# Patient Record
Sex: Male | Born: 1980 | Race: Black or African American | Hispanic: No | Marital: Single | Smoking: Current every day smoker
Health system: Southern US, Community
[De-identification: ages and names within clinical notes are randomized; demographics above are authoritative.]

## PROBLEM LIST (undated history)

## (undated) DIAGNOSIS — F209 Schizophrenia, unspecified: Secondary | ICD-10-CM

---

## 2015-06-19 ENCOUNTER — Emergency Department (HOSPITAL_COMMUNITY)
Admission: EM | Admit: 2015-06-19 | Discharge: 2015-06-21 | Disposition: A | Discharge status: Other Acute Inpatient Hospital | Payer: Medicaid Other | Attending: Emergency Medicine | Admitting: Emergency Medicine

## 2015-06-19 ENCOUNTER — Encounter (HOSPITAL_COMMUNITY): Payer: Self-pay | Admitting: Emergency Medicine

## 2015-06-19 DIAGNOSIS — F209 Schizophrenia, unspecified: Secondary | ICD-10-CM | POA: Diagnosis present

## 2015-06-19 DIAGNOSIS — F200 Paranoid schizophrenia: Secondary | ICD-10-CM | POA: Diagnosis present

## 2015-06-19 HISTORY — DX: Schizophrenia, unspecified: F20.9

## 2015-06-19 LAB — CBC
HCT: 49.4 % (ref 39.0–52.0)
HEMOGLOBIN: 17.9 g/dL — AB (ref 13.0–17.0)
MCH: 32.3 pg (ref 26.0–34.0)
MCHC: 36.2 g/dL — ABNORMAL HIGH (ref 30.0–36.0)
MCV: 89.2 fL (ref 78.0–100.0)
PLATELETS: 249 10*3/uL (ref 150–400)
RBC: 5.54 MIL/uL (ref 4.22–5.81)
RDW: 12.3 % (ref 11.5–15.5)
WBC: 10 10*3/uL (ref 4.0–10.5)

## 2015-06-19 LAB — RAPID URINE DRUG SCREEN, HOSP PERFORMED
AMPHETAMINES: NOT DETECTED
BENZODIAZEPINES: NOT DETECTED
Barbiturates: NOT DETECTED
COCAINE: NOT DETECTED
OPIATES: NOT DETECTED
TETRAHYDROCANNABINOL: POSITIVE — AB

## 2015-06-19 LAB — COMPREHENSIVE METABOLIC PANEL
ALK PHOS: 48 U/L (ref 38–126)
ALT: 38 U/L (ref 17–63)
ANION GAP: 9 (ref 5–15)
AST: 27 U/L (ref 15–41)
Albumin: 4.7 g/dL (ref 3.5–5.0)
BUN: 11 mg/dL (ref 6–20)
CALCIUM: 9.5 mg/dL (ref 8.9–10.3)
CO2: 20 mmol/L — AB (ref 22–32)
Chloride: 107 mmol/L (ref 101–111)
Creatinine, Ser: 1.01 mg/dL (ref 0.61–1.24)
GFR calc non Af Amer: >60 mL/min (ref >60)
Glucose, Bld: 105 mg/dL — ABNORMAL HIGH (ref 65–99)
POTASSIUM: 3.7 mmol/L (ref 3.5–5.1)
SODIUM: 136 mmol/L (ref 135–145)
TOTAL PROTEIN: 8.3 g/dL — AB (ref 6.5–8.1)
Total Bilirubin: 0.8 mg/dL (ref 0.3–1.2)

## 2015-06-19 LAB — ETHANOL: Alcohol, Ethyl (B): <5 mg/dL (ref <5)

## 2015-06-19 LAB — ACETAMINOPHEN LEVEL

## 2015-06-19 LAB — SALICYLATE LEVEL

## 2015-06-19 MED ORDER — NICOTINE 21 MG/24HR TD PT24
21.0000 mg | MEDICATED_PATCH | Freq: Every day | TRANSDERMAL | Status: DC
  Administered 2015-06-20 – 2015-06-21 (×2): 21 mg via TRANSDERMAL
  Filled 2015-06-19: qty 1

## 2015-06-19 MED ORDER — IBUPROFEN 200 MG PO TABS: 600.0000 mg | ORAL_TABLET | Freq: Three times a day (TID) | ORAL | Status: DC | PRN

## 2015-06-19 MED ORDER — ACETAMINOPHEN 325 MG PO TABS
650.0000 mg | ORAL_TABLET | ORAL | Status: DC | PRN
  Administered 2015-06-19: 650 mg via ORAL
  Filled 2015-06-19: qty 2

## 2015-06-19 MED ORDER — LORAZEPAM 1 MG PO TABS
0.0000 mg | ORAL_TABLET | Freq: Two times a day (BID) | ORAL | Status: DC
Start: 2015-06-22 — End: 2015-06-20

## 2015-06-19 MED ORDER — LORAZEPAM 1 MG PO TABS: 1.0000 mg | ORAL_TABLET | Freq: Three times a day (TID) | ORAL | Status: DC | PRN

## 2015-06-19 MED ORDER — ONDANSETRON HCL 4 MG PO TABS: 4.0000 mg | ORAL_TABLET | Freq: Three times a day (TID) | ORAL | Status: DC | PRN

## 2015-06-19 MED ORDER — LORAZEPAM 1 MG PO TABS
0.0000 mg | ORAL_TABLET | Freq: Four times a day (QID) | ORAL | Status: DC
  Administered 2015-06-19 – 2015-06-20 (×2): 2 mg via ORAL
  Filled 2015-06-19 (×2): qty 2

## 2015-06-19 NOTE — ED Notes (Signed)
[facility] Expected date: 06/19/15 Expected time: 11:28 PM Means of arrival:  Comments: [employer]
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
## 2015-06-19 NOTE — ED Notes (Signed)
Patient irritable, does not want to be in hospital. Patient restless, pacing, punched walls in his room. Hesitantly agreed to take Ativan and Tylenol.  Oriented to the unit. Encouragement offered. Meal provided.   Q 15 safety checks in place.
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
## 2015-06-19 NOTE — ED Notes (Signed)
Patient is hitting the walls and television, he also pacing back and fourth in and out of his room.

## 2015-06-20 DIAGNOSIS — F200 Paranoid schizophrenia: Secondary | ICD-10-CM | POA: Diagnosis present

## 2015-06-20 MED ORDER — BENZTROPINE MESYLATE 1 MG PO TABS
0.5000 mg | ORAL_TABLET | Freq: Two times a day (BID) | ORAL | Status: DC
  Administered 2015-06-20 – 2015-06-21 (×3): 0.5 mg via ORAL
  Filled 2015-06-20 (×3): qty 1

## 2015-06-20 MED ORDER — TRAZODONE HCL 100 MG PO TABS: 100.0000 mg | ORAL_TABLET | Freq: Every evening | ORAL | Status: DC | PRN

## 2015-06-20 MED ORDER — HALOPERIDOL 5 MG PO TABS
5.0000 mg | ORAL_TABLET | Freq: Two times a day (BID) | ORAL | Status: DC
  Administered 2015-06-20 – 2015-06-21 (×3): 5 mg via ORAL
  Filled 2015-06-20 (×3): qty 1

## 2015-06-20 MED ORDER — OXCARBAZEPINE 300 MG PO TABS
300.0000 mg | ORAL_TABLET | Freq: Two times a day (BID) | ORAL | Status: DC
Start: 2015-06-20 — End: 2015-06-21
  Administered 2015-06-20 – 2015-06-21 (×3): 300 mg via ORAL
  Filled 2015-06-20 (×3): qty 1

## 2015-06-20 NOTE — ED Notes (Signed)
Patient noted sleeping in room. No complaints, stable, in no acute distress. Q15 minute rounds and monitoring via [employer] to continue.  

## 2015-06-20 NOTE — BH Assessment (Signed)
[facility] Assessment Progress Note  Per Thedore MinsMojeed Akintayo, MD, this pt requires psychiatric hospitalization at this time.  He presents under faulty IVC initiated by his brother, but Dr Jannifer FranklinAkintayo has initiated a new IVC.  [place], and at [employer]17 Magistrate Antonelli confirms receipt.  Berneice Heinrichina Tate, RN, [facility] has assigned pt to Rm [id number].  [facility]  Pt's nurse has been notified, and agrees to call report to [phone].  Pt is to be transported via Patent examinerlaw enforcement.  Doylene Canninghomas Rilla Buckman, MA Triage Specialist [phone]

## 2015-06-20 NOTE — Consult Note (Signed)
[facility] Consult   Reason for Consult:  Aggressive behavior Referring Physician:  ED Provider Patient Identification: Mike [name] MRN:  [id number] Principal Diagnosis: Paranoid schizophrenia [facility]) Diagnosis:   Patient Active Problem List   Diagnosis Date Noted  . Paranoid schizophrenia ([place]) [F20.0] 06/20/2015    Total Time spent with patient: 30 minutes  Subjective:   Mike [name] is a 35 y.o. male patient admitted with aggressive behavior.  HPI:  Mike [name] was was seen today in the ED for aggressive behaviors.  It was reported that he lived with his brother and they got into an argument just before he was brought into the ED.  He has a history of schizoaffective disorder.  He was living in [place] up until recently and ran out of his medications.  He denied non compliance with his meds.  He reports that he hears voices intermittently and is a "little paranoid" at times.  Per patient he takes Depakote, Haldol, Cogentin and Benadryl.  He has been in [place] 2-3 months.    Per chart review, the patient has knocked down holes in the wall, extremely violent and known to self mutilate.    Past Psychiatric History: see HPI  Risk to Self: Suicidal Ideation: No Suicidal Intent: No Is patient at risk for suicide?: No Suicidal Plan?: No Access to Means: No What has been your use of drugs/alcohol within the last 12 months?: Pt denies but Pt's brother reports Pt uses cocaine, marijuana and alcohol How many times?: 0 Other Self Harm Risks: Pt has history of cutting himself Triggers for Past Attempts: None known Intentional Self Injurious Behavior: Cutting Comment - Self Injurious Behavior: Pt has history of cutting his arms Risk to Others: Homicidal Ideation: No Thoughts of Harm to Others: No Current Homicidal Intent: No Current Homicidal Plan: No Access to Homicidal Means: No Identified Victim: None History of harm to others?: Yes Assessment of Violence: On  admission Violent Behavior Description: See assessment note Does patient have access to weapons?: No Criminal Charges Pending?: No Does patient have a court date: No Prior Inpatient Therapy: Prior Inpatient Therapy: Yes Prior Therapy Dates: unknown Prior Therapy Facilty/Provider(s): unknown Reason for Treatment: unknown Prior Outpatient Therapy: Prior Outpatient Therapy: Yes Prior Therapy Dates: 2016 Prior Therapy Facilty/Provider(s): Provider in [place] Reason for Treatment: Schizophrenia Does patient have an ACCT team?: No Does patient have Intensive In-House Services?  : No Does patient have [facility] services? : No Does patient have P4CC services?: No  Past Medical History:  Past Medical History  Diagnosis Date  . Schizophrenia ([place])    History reviewed. No pertinent past surgical history. Family History: No family history on file. Family Psychiatric  History: see HPI Social History:  History  Alcohol Use  . Yes     History  Drug Use Not on file    Social History   Social History  . Marital Status: Single    Spouse Name: N/A  . Number of Children: N/A  . Years of Education: N/A   Social History Main Topics  . Smoking status: Never Smoker   . Smokeless tobacco: None  . Alcohol Use: Yes  . Drug Use: None  . Sexual Activity: Not Asked   Other Topics Concern  . None   Social History Narrative  . None   Additional Social History:    Allergies:  Not on File  Labs:  Results for orders placed or performed during the hospital encounter of 06/19/15 (from the past 48 hour(s))  Comprehensive metabolic panel     Status: Abnormal   Collection Time: 06/19/15  8:08 PM  Result Value Ref Range   Sodium 136 135 - 145 mmol/L   Potassium 3.7 3.5 - 5.1 mmol/L   Chloride 107 101 - 111 mmol/L   CO2 20 (L) 22 - 32 mmol/L   Glucose, Bld 105 (H) 65 - 99 mg/dL   BUN 11 6 - 20 mg/dL   Creatinine, Ser 1.01 0.61 - 1.24 mg/dL   Calcium 9.5 8.9 - 10.3 mg/dL   Total Protein  8.3 (H) 6.5 - 8.1 g/dL   Albumin 4.7 3.5 - 5.0 g/dL   AST 27 15 - 41 U/L   ALT 38 17 - 63 U/L   Alkaline Phosphatase 48 38 - 126 U/L   Total Bilirubin 0.8 0.3 - 1.2 mg/dL   GFR calc non Af Amer >60 >60 mL/min   GFR calc Af Amer >60 >60 mL/min    Comment: (NOTE) The eGFR has been calculated using the CKD EPI equation. This calculation has not been validated in all clinical situations. eGFR's persistently <60 mL/min signify possible Chronic Kidney Disease.    Anion gap 9 5 - 15  cbc     Status: Abnormal   Collection Time: 06/19/15  8:08 PM  Result Value Ref Range   WBC 10.0 4.0 - 10.5 K/uL   RBC 5.54 4.22 - 5.81 MIL/uL   Hemoglobin 17.9 (H) 13.0 - 17.0 g/dL   HCT 49.4 39.0 - 52.0 %   MCV 89.2 78.0 - 100.0 fL   MCH 32.3 26.0 - 34.0 pg   MCHC 36.2 (H) 30.0 - 36.0 g/dL   RDW 12.3 11.5 - 15.5 %   Platelets 249 150 - 400 K/uL  Ethanol     Status: None   Collection Time: 06/19/15  8:09 PM  Result Value Ref Range   Alcohol, Ethyl (B) <5 <5 mg/dL    Comment:        LOWEST DETECTABLE LIMIT FOR SERUM ALCOHOL IS 5 mg/dL FOR MEDICAL PURPOSES ONLY   Salicylate level     Status: None   Collection Time: 06/19/15  8:09 PM  Result Value Ref Range   Salicylate Lvl <3.8 2.8 - 30.0 mg/dL  Acetaminophen level     Status: Abnormal   Collection Time: 06/19/15  8:09 PM  Result Value Ref Range   Acetaminophen (Tylenol), Serum <10 (L) 10 - 30 ug/mL    Comment:        THERAPEUTIC CONCENTRATIONS VARY SIGNIFICANTLY. A RANGE OF 10-30 ug/mL MAY BE AN EFFECTIVE CONCENTRATION FOR MANY PATIENTS. HOWEVER, SOME ARE BEST TREATED AT CONCENTRATIONS OUTSIDE THIS RANGE. ACETAMINOPHEN CONCENTRATIONS >150 ug/mL AT 4 HOURS AFTER INGESTION AND >50 ug/mL AT 12 HOURS AFTER INGESTION ARE OFTEN ASSOCIATED WITH TOXIC REACTIONS.   Rapid urine drug screen (hospital performed)     Status: Abnormal   Collection Time: 06/19/15  9:34 PM  Result Value Ref Range   Opiates NONE DETECTED NONE DETECTED   Cocaine  NONE DETECTED NONE DETECTED   Benzodiazepines NONE DETECTED NONE DETECTED   Amphetamines NONE DETECTED NONE DETECTED   Tetrahydrocannabinol POSITIVE (A) NONE DETECTED   Barbiturates NONE DETECTED NONE DETECTED    Comment:        DRUG SCREEN FOR MEDICAL PURPOSES ONLY.  IF CONFIRMATION IS NEEDED FOR ANY PURPOSE, NOTIFY LAB WITHIN 5 DAYS.        LOWEST DETECTABLE LIMITS FOR URINE DRUG SCREEN Drug Class  Cutoff (ng/mL) Amphetamine      1000 Barbiturate      200 Benzodiazepine   147 Tricyclics       829 Opiates          300 Cocaine          300 THC              50     Current Facility-Administered Medications  Medication Dose Route Frequency Provider Last Rate Last Dose  . acetaminophen (TYLENOL) tablet 650 mg  650 mg Oral Q4H PRN Sherwood Gambler, MD   650 mg at 06/19/15 2343  . benztropine (COGENTIN) tablet 0.5 mg  0.5 mg Oral BID Weylin Plagge, MD   0.5 mg at 06/20/15 1336  . haloperidol (HALDOL) tablet 5 mg  5 mg Oral BID Corena Pilgrim, MD   5 mg at 06/20/15 1336  . ibuprofen (ADVIL,MOTRIN) tablet 600 mg  600 mg Oral Q8H PRN Sherwood Gambler, MD      . LORazepam (ATIVAN) tablet 1 mg  1 mg Oral Q8H PRN Sherwood Gambler, MD      . nicotine (NICODERM CQ - dosed in mg/24 hours) patch 21 mg  21 mg Transdermal Daily Sherwood Gambler, MD   21 mg at 06/20/15 1128  . ondansetron (ZOFRAN) tablet 4 mg  4 mg Oral Q8H PRN Sherwood Gambler, MD      . Oxcarbazepine (TRILEPTAL) tablet 300 mg  300 mg Oral BID Corena Pilgrim, MD   300 mg at 06/20/15 1338  . traZODone (DESYREL) tablet 100 mg  100 mg Oral QHS PRN Corena Pilgrim, MD       Current Outpatient Prescriptions  Medication Sig Dispense Refill  . benztropine (COGENTIN) 1 MG tablet Take 1 mg by mouth 2 (two) times daily.    . divalproex (DEPAKOTE ER) 500 MG 24 hr tablet Take 500 mg by mouth every 12 (twelve) hours.    . haloperidol (HALDOL) 5 MG tablet Take 5 mg by mouth 2 (two) times daily.      Musculoskeletal: Strength & Muscle  Tone: within normal limits Gait & Station: normal Patient leans: N/A  Psychiatric Specialty Exam: Physical Exam  Vitals reviewed. Psychiatric: His mood appears anxious. Thought content is not paranoid. He exhibits a depressed mood. He expresses no homicidal and no suicidal ideation.    Review of Systems  All other systems reviewed and are negative.   Blood pressure 117/67, pulse 94, temperature 98.2 F (36.8 C), temperature source Oral, resp. rate 20, SpO2 99 %.There is no height or weight on file to calculate BMI.  General Appearance: Neat  Eye Contact:  Good  Speech:  Garbled and Slow  Volume:  Decreased  Mood:  Anxious and Irritable  Affect:  Constricted and Depressed  Thought Process:  Disorganized  Orientation:  Full (Time, Place, and Person)  Thought Content:  Rumination  Suicidal Thoughts:  No  Homicidal Thoughts:  No  Memory:  Immediate;   Fair Recent;   Fair Remote;   Fair  Judgement:  Fair  Insight:  Fair  Psychomotor Activity:  Normal  Concentration:  Concentration: Good and Attention Span: Good  Recall:  Good  Fund of Knowledge:  Good  Language:  Good  Akathisia:  Negative  Handed:  Right  AIMS (if indicated):     Assets:  Communication Skills Physical Health Resilience  ADL's:  Intact  Cognition:  WNL  Sleep:  6 hrs   Treatment Plan Summary: TO [facility] 503-1  Disposition: Recommend psychiatric  Inpatient admission when medically cleared. Supportive therapy provided about ongoing stressors. Discussed crisis plan, support from social network, calling 911, coming to the Emergency Department, and calling Suicide Hotline.  Reviewed case with Dr Darleene Cleaver Reviewed labs and chart record.  Janett Labella, NP [facility] 06/20/2015 3:26 PM Patient seen face-to-face for psychiatric evaluation, chart reviewed and case discussed with the physician extender and developed treatment plan. Reviewed the information documented and agree with the treatment plan. Corena Pilgrim,  MD

## 2015-06-20 NOTE — BH Assessment (Signed)
[facility] Assessment Progress Note    [facility]

## 2015-06-20 NOTE — ED Notes (Signed)
Patient has been resting quietly in his room.  Patient reports HI toward his brother with whom he lives.  Patient has exhibited no aggressive behaviors this shift.

## 2015-06-20 NOTE — ED Notes (Signed)
Patient noted in room. No complaints, stable, in no acute distress. Q15 minute rounds and monitoring via Security Cameras to continue.  

## 2015-06-20 NOTE — ED Notes (Signed)
[facility] Patient alert and oriented, warm and dry, in no acute distress. Patient denies SI, HI, AVH and pain. Patient made aware of Q15 minute rounds and security cameras for their safety. Patient instructed to come to me with needs or concerns.

## 2015-06-21 ENCOUNTER — Encounter (HOSPITAL_COMMUNITY): Payer: Self-pay | Admitting: Registered Nurse

## 2015-06-21 ENCOUNTER — Encounter (HOSPITAL_COMMUNITY): Payer: Self-pay | Admitting: *Deleted

## 2015-06-21 ENCOUNTER — Inpatient Hospital Stay (HOSPITAL_COMMUNITY)
Admission: AD | Admit: 2015-06-21 | Discharge: 2015-07-01 | DRG: 885 | Disposition: A | Discharge status: Home / Self Care | Payer: Medicaid Other | Source: Intra-hospital | Attending: Psychiatry | Admitting: Psychiatry

## 2015-06-21 DIAGNOSIS — F209 Schizophrenia, unspecified: Secondary | ICD-10-CM | POA: Diagnosis not present

## 2015-06-21 DIAGNOSIS — Z9119 Patient's noncompliance with other medical treatment and regimen: Secondary | ICD-10-CM

## 2015-06-21 DIAGNOSIS — F259 Schizoaffective disorder, unspecified: Secondary | ICD-10-CM | POA: Diagnosis not present

## 2015-06-21 DIAGNOSIS — F419 Anxiety disorder, unspecified: Secondary | ICD-10-CM | POA: Diagnosis present

## 2015-06-21 DIAGNOSIS — F1210 Cannabis abuse, uncomplicated: Secondary | ICD-10-CM | POA: Diagnosis present

## 2015-06-21 DIAGNOSIS — F6381 Intermittent explosive disorder: Secondary | ICD-10-CM | POA: Diagnosis present

## 2015-06-21 DIAGNOSIS — G4700 Insomnia, unspecified: Secondary | ICD-10-CM | POA: Diagnosis present

## 2015-06-21 DIAGNOSIS — F250 Schizoaffective disorder, bipolar type: Secondary | ICD-10-CM | POA: Diagnosis present

## 2015-06-21 DIAGNOSIS — G4710 Hypersomnia, unspecified: Secondary | ICD-10-CM | POA: Diagnosis present

## 2015-06-21 MED ORDER — OXCARBAZEPINE 300 MG PO TABS
300.0000 mg | ORAL_TABLET | Freq: Two times a day (BID) | ORAL | Status: DC
  Administered 2015-06-22 – 2015-06-24 (×6): 300 mg via ORAL
  Filled 2015-06-21 (×9): qty 1

## 2015-06-21 MED ORDER — IBUPROFEN 600 MG PO TABS
600.0000 mg | ORAL_TABLET | Freq: Three times a day (TID) | ORAL | Status: DC | PRN
  Administered 2015-06-21 – 2015-06-23 (×2): 600 mg via ORAL
  Filled 2015-06-21 (×2): qty 1

## 2015-06-21 MED ORDER — MAGNESIUM HYDROXIDE 400 MG/5ML PO SUSP: 30.0000 mL | Freq: Every day | ORAL | Status: DC | PRN

## 2015-06-21 MED ORDER — NICOTINE 21 MG/24HR TD PT24
21.0000 mg | MEDICATED_PATCH | Freq: Every day | TRANSDERMAL | Status: DC
  Administered 2015-06-23 – 2015-07-01 (×8): 21 mg via TRANSDERMAL
  Filled 2015-06-21 (×12): qty 1

## 2015-06-21 MED ORDER — BENZTROPINE MESYLATE 0.5 MG PO TABS
0.5000 mg | ORAL_TABLET | Freq: Two times a day (BID) | ORAL | Status: DC
  Administered 2015-06-22 – 2015-06-24 (×6): 0.5 mg via ORAL
  Filled 2015-06-21 (×9): qty 1

## 2015-06-21 MED ORDER — ONDANSETRON HCL 4 MG PO TABS: 4.0000 mg | ORAL_TABLET | Freq: Three times a day (TID) | ORAL | Status: DC | PRN

## 2015-06-21 MED ORDER — ALUM & MAG HYDROXIDE-SIMETH 200-200-20 MG/5ML PO SUSP: 30.0000 mL | ORAL | Status: DC | PRN

## 2015-06-21 MED ORDER — TRAZODONE HCL 100 MG PO TABS
100.0000 mg | ORAL_TABLET | Freq: Every evening | ORAL | Status: DC | PRN
  Administered 2015-06-22: 100 mg via ORAL
  Filled 2015-06-21 (×2): qty 1

## 2015-06-21 MED ORDER — LORAZEPAM 1 MG PO TABS
1.0000 mg | ORAL_TABLET | Freq: Three times a day (TID) | ORAL | Status: DC | PRN
  Administered 2015-06-22 – 2015-06-23 (×2): 1 mg via ORAL
  Filled 2015-06-21 (×3): qty 1

## 2015-06-21 MED ORDER — HALOPERIDOL 5 MG PO TABS
5.0000 mg | ORAL_TABLET | Freq: Two times a day (BID) | ORAL | Status: DC
  Administered 2015-06-22 – 2015-06-23 (×4): 5 mg via ORAL
  Filled 2015-06-21 (×9): qty 1

## 2015-06-21 NOTE — ED Notes (Signed)
Patient noted sleeping in room. No complaints, stable, in no acute distress. Q15 minute rounds and monitoring via [employer] to continue.  

## 2015-06-21 NOTE — ED Notes (Addendum)
Pt ambulatory w/o difficulty to [facility] with [facility], belongings given to [employer] 

## 2015-06-21 NOTE — [facility] Group Notes (Signed)
Adult Psychoeducational Group Note  Date:  06/21/2015 Time:  8:53 PM  Group Topic/Focus:  Wrap-Up Group:   The focus of this group is to help patients review their daily goal of treatment and discuss progress on daily workbooks.  Participation Level:  Did Not Attend  Participation Quality:  None  Affect:  None  Cognitive:  None  Insight: None  Engagement in Group:  None  Modes of Intervention:  Discussion  Additional Comments:  Pt did not attend group.  Caroll RancherLindsay, Haylen Bellotti A 06/21/2015, 8:53 PM

## 2015-06-21 NOTE — ED Notes (Signed)
[facility] [employer] [facility]

## 2015-06-21 NOTE — ED Notes (Signed)
[employer] is here to transport 

## 2015-06-21 NOTE — ED Notes (Signed)
Report called, will call when he can transport

## 2015-06-21 NOTE — ED Notes (Signed)
Pt scratching at nicotine patch, pt removed patch and reapplied it.  Pt encouraged to talk with staff on arrival about switching to the gum.

## 2015-06-21 NOTE — Progress Notes (Signed)
Admission Note:  35 year old male who presents IVC, in no acute distress, for the treatment of Aggression and Psychosis.  Patient reports that he got into an argument with his brother which resulted in patient's brother bringing him to the hospital.  Patient appears flat and agitated.  Patient became increasingly agitated during admission process and responded with one word answers majority of the admisson. Patient denied SI/HI.  Patient laughed inappropriately at times but denied AVH.  Patient denied tobacco, drug, and/or alcohol use. Patient currently lives with his brother and identifies his brother as his support system.     Skin was assessed and found to be clear of any abnormal marks apart from scratches on left upper arm.  Patient searched and no contraband found, POC and unit policies explained and understanding verbalized. Consents obtained. Food and fluids offered accepted.  Patient had no additional questions or concerns.

## 2015-06-21 NOTE — Tx Team (Signed)
Initial Interdisciplinary Treatment Plan   PATIENT STRESSORS: Marital or family conflict Medication change or noncompliance   PATIENT STRENGTHS: Motivation for treatment/growth Physical Health Supportive family/friends   PROBLEM LIST: Problem List/Patient Goals Date to be addressed Date deferred Reason deferred Estimated date of resolution  Psychosis 06/21/2015  06/21/2015   D/C  Aggression 06/21/2015  06/21/2015   D/C  "Nothing" 06/21/2015  06/21/2015   D/C  "Just want to sleep" 06/21/2015  06/21/2015   D/C                                 DISCHARGE CRITERIA:  Ability to meet basic life and health needs Adequate post-discharge living arrangements Improved stabilization in mood, thinking, and/or behavior Motivation to continue treatment in a less acute level of care Need for constant or close observation no longer present Verbal commitment to aftercare and medication compliance  PRELIMINARY DISCHARGE PLAN: Outpatient therapy Participate in family therapy Return to previous living arrangement  PATIENT/FAMIILY INVOLVEMENT: This treatment plan has been presented to and reviewed with the patient, Nat ChristenDonnell Henson.  The patient and family have been given the opportunity to ask questions and make suggestions.  Larry SierrasMiddleton, Zamaya Rapaport P 06/21/2015, 1:47 PM

## 2015-06-21 NOTE — ED Notes (Signed)
Resting quietly, nad.  [facility]

## 2015-06-21 NOTE — ED Notes (Signed)
[employer] contacted for transport 

## 2015-06-22 DIAGNOSIS — F259 Schizoaffective disorder, unspecified: Secondary | ICD-10-CM

## 2015-06-22 NOTE — Progress Notes (Signed)
D:Patient in bed asleep on first approach.  Patient woke up after group to get a snack.  Patient has not ordered medications but stated he would ask for something if he needed it.  Patient c/o of mild anxiety.  Patient went back to bed.  Writer checked on patient and patient stated he was going to bed for the night.  Patient denies SI/HI and denies AVH.  Patient states he did not have a goal. A: Staff to monitor Q 15 mins for safety.  Encouragement and support offered. No scheduled medications administered per orders. R: Patient remains safe on the unit.  Patient did not attend  group tonight.  Patient visible on the unit for snack.  No medications to administered tonight.
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
## 2015-06-22 NOTE — Progress Notes (Signed)
Patient ID: Mike [name], male   DOB: [birth year], 35 y.o.   MRN: [id number]   D: Pt has been very flat and depressed on the unit today. Pt has also been very agitated and irritable. Pt reported that he was just ready to leave and that someone needed to discharge him. Pt refused his morning medication, reported that he just needed to be discharged. Pt was seen by Fredna Dowakia NP, patient aggred to take medication. Pt took all medications with any problems. Pt refused to fill out his self inventory sheet. Pt reported being negative SI/HI, no AH/VH noted. A: 15 min checks continued for patient safety. R: Pt safety maintained.

## 2015-06-22 NOTE — [facility] Group Notes (Signed)
[facility] Group Notes:  (Nursing/MHT/Case Management/Adjunct)  Date:  06/22/2015  Time:  0930am  Type of Therapy:  Nurse Education  Participation Level:  Did Not Attend  Participation Quality:  Did not attend  Affect:  Did not attend  Cognitive:  Did not attend  Insight:  None  Engagement in Group:  Did not attend  Modes of Intervention:  Discussion, Education and Support  Summary of Progress/Problems: Patient was invited to group but did not attend and remained in room resting.  Jonathon BellowsBrittany G Bettyjane Shenoy 06/22/2015, 10:35 AM

## 2015-06-22 NOTE — Progress Notes (Signed)
Psychoeducational Group Note  Date:  06/22/2015 Time:  2053  Group Topic/Focus:  Wrap-Up Group:   The focus of this group is to help patients review their daily goal of treatment and discuss progress on daily workbooks.  Participation Level: Did Not Attend  Participation Quality:  Not Applicable  Affect:  Not Applicable  Cognitive:  Not Applicable  Insight:  Not Applicable  Engagement in Group: Not Applicable  Additional Comments:  The patient did not attend group this evening.   Hazle CocaGOODMAN, Avaiyah Strubel S 06/22/2015, 8:53 PM
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
## 2015-06-22 NOTE — [facility] Group Notes (Signed)
[facility] Group Notes: (Clinical Social Work)   06/22/2015      Type of Therapy:  Group Therapy   Participation Level:  Did Not Attend despite MHT prompting   Dorothymae Maciver Grossman-Orr, LCSW 06/22/2015, 2:03 PM     

## 2015-06-23 DIAGNOSIS — F250 Schizoaffective disorder, bipolar type: Secondary | ICD-10-CM | POA: Diagnosis present

## 2015-06-23 MED ORDER — HALOPERIDOL 5 MG PO TABS
5.0000 mg | ORAL_TABLET | Freq: Every day | ORAL | Status: DC
  Administered 2015-06-24 – 2015-06-30 (×6): 5 mg via ORAL
  Filled 2015-06-23 (×8): qty 1

## 2015-06-23 MED ORDER — TRAZODONE HCL 100 MG PO TABS
125.0000 mg | ORAL_TABLET | Freq: Every day | ORAL | Status: DC
  Administered 2015-06-23 – 2015-06-30 (×8): 125 mg via ORAL
  Filled 2015-06-23 (×10): qty 1

## 2015-06-23 MED ORDER — HALOPERIDOL 5 MG PO TABS
10.0000 mg | ORAL_TABLET | Freq: Every evening | ORAL | Status: DC
  Administered 2015-06-23 – 2015-06-30 (×8): 10 mg via ORAL
  Filled 2015-06-23 (×9): qty 2

## 2015-06-23 MED ORDER — TRAZODONE HCL 50 MG PO TABS: 125.0000 mg | ORAL_TABLET | Freq: Every evening | ORAL | Status: DC | PRN

## 2015-06-23 NOTE — Plan of Care (Signed)
Problem: Education: Goal: Knowledge of the prescribed therapeutic regimen will improve Outcome: Progressing Patient verbalizes understanding that milieu participation is expected as well as med compliance.
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
## 2015-06-23 NOTE — Progress Notes (Signed)
Recreation Therapy Notes  06.12.2017 approximately 3:05pm. Per MD order LRT met with patient to investigate ways to enhance tx during admission. Patient sleeping upon LRT arrival to his room, patient requested LRT return. LRT will continue to attempt to work with patient during admission.   Laureen Ochs Olin Gurski, LRT/CTRS           Dannon Nguyenthi L 06/23/2015 4:11 PM

## 2015-06-23 NOTE — Progress Notes (Signed)
Did not attend group 

## 2015-06-23 NOTE — Progress Notes (Signed)
D:Patient in bed on approach.  Patient did wake up later on tonight and stated he had a good day.  Patient took a snack and something to drink back to his room.  Patient later came up to the desk asking what he would have to do [place] the [place].  Writer asked further questions and patient states he is ready to be discharged from here and he would rather go to the county.  Patient states his reasoning is he can fight there.  Patient denies SI/HI and denies AVH but does appear to hear voices.  Patient in his room isolating an he does not engage in conversation with peers.   A: Staff to monitor Q 15 mins for safety.  Encouragement and support offered.  Scheduled medications administered per orders.  Ativan administered prn for anxiety and agitation. R: Patient remains safe on the unit.  Patient did not attend group tonight.  Patient visible on the unit briefly.  Patient taking administered medications.
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
## 2015-06-23 NOTE — Progress Notes (Signed)
Found patient resting in bed this AM. Minimal information forwarded during assessment. Brief eye contact. Anxious in affect with congruent mood. Some agitation noted. Refuses to complete self inventory. "I'm not doing no worksheet." Reports low R sided back pain of an 8/10. Medicated per orders, ibuprofen prn given for pain. Support offered. On reassess of prn, patient is asleep. Per PM shift report, patient has periods of lability, unpredictable agitation. Will continue to monitor closely.

## 2015-06-23 NOTE — [facility] Group Notes (Signed)
[facility] LCSW Group Therapy  06/23/2015 1:15 pm  Type of Therapy: Process Group Therapy  Participation Level:  Active  Participation Quality:  Appropriate  Affect:  Flat  Cognitive:  Oriented  Insight:  Improving  Engagement in Group:  Limited  Engagement in Therapy:  Limited  Modes of Intervention:  Activity, Clarification, Education, Problem-solving and Support  Summary of Progress/Problems: Today's group addressed the issue of overcoming obstacles.  Patients were asked to identify their biggest obstacle post d/c that stands in the way of their on-going success, and then problem solve as to how to manage this. Invited.  Chose to not attend.  Ida Rogueorth, Destiny Hagin B 06/23/2015   3:38 PM

## 2015-06-24 DIAGNOSIS — F6381 Intermittent explosive disorder: Secondary | ICD-10-CM | POA: Clinically undetermined

## 2015-06-24 LAB — LIPID PANEL
CHOL/HDL RATIO: 3 ratio
CHOLESTEROL: 125 mg/dL (ref 0–200)
HDL: 41 mg/dL (ref >40)
LDL Cholesterol: 60 mg/dL (ref 0–99)
Triglycerides: 118 mg/dL (ref <150)
VLDL: 24 mg/dL (ref 0–40)

## 2015-06-24 LAB — TSH: TSH: 0.715 u[IU]/mL (ref 0.350–4.500)

## 2015-06-24 MED ORDER — ZIPRASIDONE MESYLATE 20 MG IM SOLR: 10.0000 mg | Freq: Two times a day (BID) | INTRAMUSCULAR | Status: DC | PRN

## 2015-06-24 MED ORDER — BENZTROPINE MESYLATE 0.5 MG PO TABS
0.5000 mg | ORAL_TABLET | Freq: Every day | ORAL | Status: DC
  Administered 2015-06-25 – 2015-07-01 (×6): 0.5 mg via ORAL
  Filled 2015-06-24 (×8): qty 1

## 2015-06-24 MED ORDER — OXCARBAZEPINE 300 MG PO TABS
600.0000 mg | ORAL_TABLET | Freq: Every evening | ORAL | Status: DC
  Administered 2015-06-24 – 2015-06-30 (×7): 600 mg via ORAL
  Filled 2015-06-24 (×8): qty 2

## 2015-06-24 MED ORDER — LORAZEPAM 1 MG PO TABS: 1.0000 mg | ORAL_TABLET | Freq: Four times a day (QID) | ORAL | Status: DC | PRN

## 2015-06-24 MED ORDER — HALOPERIDOL 2 MG PO TABS
2.0000 mg | ORAL_TABLET | Freq: Every day | ORAL | Status: DC
  Administered 2015-06-24 – 2015-06-30 (×7): 2 mg via ORAL
  Filled 2015-06-24 (×9): qty 1

## 2015-06-24 MED ORDER — BENZTROPINE MESYLATE 0.5 MG PO TABS
0.5000 mg | ORAL_TABLET | Freq: Every day | ORAL | Status: DC
  Administered 2015-06-24 – 2015-06-30 (×7): 0.5 mg via ORAL
  Filled 2015-06-24 (×8): qty 1

## 2015-06-24 MED ORDER — LORAZEPAM 2 MG/ML IJ SOLN: 1.0000 mg | Freq: Four times a day (QID) | INTRAMUSCULAR | Status: DC | PRN

## 2015-06-24 MED ORDER — ZIPRASIDONE HCL 20 MG PO CAPS: 20.0000 mg | ORAL_CAPSULE | Freq: Two times a day (BID) | ORAL | Status: DC | PRN

## 2015-06-24 MED ORDER — OXCARBAZEPINE 300 MG PO TABS
300.0000 mg | ORAL_TABLET | Freq: Every day | ORAL | Status: DC
  Administered 2015-06-25 – 2015-07-01 (×7): 300 mg via ORAL
  Filled 2015-06-24 (×8): qty 1

## 2015-06-24 MED ORDER — BENZTROPINE MESYLATE 0.5 MG PO TABS
0.5000 mg | ORAL_TABLET | Freq: Every evening | ORAL | Status: DC
  Administered 2015-06-24 – 2015-06-30 (×7): 0.5 mg via ORAL
  Filled 2015-06-24 (×8): qty 1

## 2015-06-24 NOTE — Progress Notes (Signed)
D-  Patient denies SI, HI and AVH this shift.  Patient has been staying in his room but has been compliant with medications.  Patient has had no incident of behavioral dyscontrol.   A- Assess patient for safety, offer medications as prescribed, engage patient in 1:1 staff talks.   R-  Patient able to contract for safety.
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
## 2015-06-24 NOTE — Progress Notes (Signed)
Recreation Therapy Notes  Animal-Assisted Activity (AAA) Program Checklist/Progress Notes Patient Eligibility Criteria Checklist & Daily Group note for Rec Tx Intervention  Date: 06.13.2017 Time: 2:45pm [address]    AAA/T Program Assumption of Risk Form signed by Patient/ or Parent Legal Guardian NO  Behavioral Response: Did not attend. Patient discussed with MD for appropriateness in pet therapy session. Both LRT and MD agree patient is appropriate for participation. Patient offered attendance to session, patient declined, stating he would rather sleep.   Marykay Lexenise L Canesha Tesfaye, LRT/CTRS        Mylon Mabey L 06/24/2015 3:08 PM

## 2015-06-24 NOTE — Progress Notes (Signed)
D: Client in bed initially, reporting depression "8" of 10. Client gets up with staff encouragement to get medications. Upon initiating conversation client reveals to Clinical research associatewriter, "I'm down here with my brother, and them" "from [place]"  "I'm an artist" Client tells Clinical research associatewriter he was off his medications before [place]. 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 Medications reviewed, administered as ordered. Staff will monitor q4815min for safety. R: Client is safe on the unit, did not attend group. Client stays in room this shift, no peer interaction.
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
## 2015-06-24 NOTE — [facility] Group Notes (Signed)
[facility] LCSW Group Therapy  06/24/2015 , 1:00 PM   Type of Therapy:  Group Therapy  Participation Level:  Active  Participation Quality:  Attentive  Affect:  Appropriate  Cognitive:  Alert  Insight:  Improving  Engagement in Therapy:  Engaged  Modes of Intervention:  Discussion, Exploration and Socialization  Summary of Progress/Problems: Today's group focused on the term Diagnosis.  Participants were asked to define the term, and then pronounce whether it is a negative, positive or neutral term. Invited.  Chose to not attend  Mike [name] B 06/24/2015 , 1:00 PM   

## 2015-06-24 NOTE — Progress Notes (Signed)
Adult Psychoeducational Group Note  Date:  06/24/2015 Time:  8:15 PM  Group Topic/Focus:  Wrap-Up Group:   The focus of this group is to help patients review their daily goal of treatment and discuss progress on daily workbooks.  Participation Level:  Did Not Attend  Pt did not attend wrap-up group.    Cleotilde NeerJasmine S Shariah Assad 06/24/2015, 8:39 PM

## 2015-06-25 LAB — HEMOGLOBIN A1C
Hgb A1c MFr Bld: 5.3 % (ref 4.8–5.6)
Mean Plasma Glucose: 105 mg/dL

## 2015-06-25 NOTE — Progress Notes (Signed)
D- Patient is in a depressed mood with a flat and blunted affect.  Patient observed in his room resting.  Patient denies SI, HI, AVH, and pain. Patient came up to the medication window and took his scheduled medications without issue.  Patient has complaints of "tiredness" and being "hungry".  Patient did not attend wrap-up group.   A- Scheduled medications administered to patient, per MD orders. Patient was provided with a meal.  Support and encouragement provided.  Routine safety checks conducted every 15 minutes.  Patient informed to notify staff with problems or concerns.  R- No adverse drug reactions noted. Patient contracts for safety at this time. Patient compliant with medications and treatment plan.  Patient remains safe at this time.

## 2015-06-25 NOTE — Progress Notes (Addendum)
Data Reports sleeping fair without PRN sleep med.  Rates depression 0/10, hopelessness 0/10, and anxiety 0/10. Affect blunted and sullen, when asked about his mood patient stated "I'm just tired, I'm not depressed."  Denies HI, SI, AVH.  Patient slow, laying in bed, needed prompts to get up for meds and breakfast but cooperative.  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  He spoke about his uncle who is an Tree [place]artist [place].   Action Nurse provided pencil and paper, engaged patient in discussion about art, encouraged patient to draw something if he would like to. Encouraged patient to get out of bed and attend groups. Remained on 15 minute checks.  Response Patient did not draw anything "I would like to maybe do that tomorrow."  Remained safe on unit.  Did not attend groups today.  No aggressive behavior or lability observed.
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
## 2015-06-26 NOTE — Plan of Care (Signed)
Problem: Activity: Goal: Interest or engagement in activities will improve Outcome: Progressing Pt attended scheduled groups. Went off unit with peers to courtyard for recreational time. Returned to unit without issues. Safety maintained.    Problem: Safety: Goal: Ability to remain free from injury will improve Outcome: Progressing Pt remains safe on and off unit, has not exhibited self injurious behavior or outburst thus far.

## 2015-06-26 NOTE — [facility] Group Notes (Signed)
[facility] Group Notes:  (Counselor/Nursing/MHT/Case Management/Adjunct)  06/26/2015 1:15PM  Type of Therapy:  Group Therapy  Participation Level:  Active  Participation Quality:  Appropriate  Affect:  Flat  Cognitive:  Oriented  Insight:  Improving  Engagement in Group:  Limited  Engagement in Therapy:  Limited  Modes of Intervention:  Discussion, Exploration and Socialization  Summary of Progress/Problems: The topic for group was balance in life.  Pt participated in the discussion about when their life was in balance and out of balance and how this feels.  Pt discussed ways to get back in balance and short term goals they can work on to get where they want to be.  Stayed the entire time, engaged throughout.  "I know I'm not balanced yet because I don't have the energy I need to do what i have to do."  Talked about needing mental breaks periodically, and he gets that by taking naps.  Admitted that he has a hard time giving himself a break when he makes mistakes, and he knows that he is hard on himself.  Wants to try to change that.  "After all, no one's perfect.'  Ida Rogueorth, Lemmie Vanlanen B 06/26/2015 4:30 PM

## 2015-06-26 NOTE — Clinical Social Work Note (Signed)
Spoke with both brother here in [place] and sister in [place].  Mike [name] is not welcome to return to any family member at this point.
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
## 2015-06-26 NOTE — Progress Notes (Signed)
Patient ID: Mike [name], male   DOB: 1980/06/26, 35 y.o.   MRN: [id number] D: Client in bed most of the shift, up for snacks. "chilling, I'm tired, got to get use to they way they give medicine, I'm getting use to it though, but I like the way they do it, but my body be tired" Client reports depression "6" of 10. Client reports of family "they don't won't me back, but I'm all right with it" "can't rely on anybody anymore, go to go out on my own" I'm all right with it"  "my family ain't shit" "I know them I was raised with them I know how the girls do and I know them boys" "if they don't bother me I won't bother them" A: Writer provided emotional support, encouraged client to use coping skills to redirect anger as it is not appropriate to break up other peoples belongings when angry. "how do I do that" client encouraged to use deep breathing, step away from situation, count to ten. Reviewed medication, administered as prescribed. Staff will monitor q2415min for safety. R: Client is safe on the unit.

## 2015-06-27 MED ORDER — ACETAMINOPHEN 325 MG PO TABS
ORAL_TABLET | ORAL | Status: AC
  Filled 2015-06-27: qty 2

## 2015-06-27 MED ORDER — ACETAMINOPHEN 325 MG PO TABS
650.0000 mg | ORAL_TABLET | ORAL | Status: DC | PRN
  Administered 2015-06-27: 650 mg via ORAL

## 2015-06-27 NOTE — [facility] Group Notes (Signed)
[facility] LCSW Group Therapy  06/27/2015  1:05 PM  Type of Therapy:  Group therapy  Participation Level:  Active  Participation Quality:  Attentive  Affect:  Flat  Cognitive:  Oriented  Insight:  Limited  Engagement in Therapy:  Limited  Modes of Intervention:  Discussion, Socialization  Summary of Progress/Problems:  Chaplain was here to lead a group on themes of hope and courage. Invited.  Chose to not come. Daryel Geraldorth, Janeane Cozart B 06/27/2015 1:04 PM

## 2015-06-27 NOTE — Progress Notes (Signed)
DAR NOTE: Patient presents with irritable and anxious mood and affect.  Denies auditory and visual hallucinations.  Rates depression at 8, hopelessness at 6, and anxiety at 9.  Described energy level as normal and concentration as poor.  Maintained on routine safety checks.  Support and encouragement offered as needed.  Attended group and participated.  States goal for today is "just to stay calm."  Patient was on the phone most of this shift trying to make living arrangement before discharge.  Patient requested and received Tylenol 650 mg for complain of headache and generalized pain with good effect.  Patient refused morning medications after several encouragement.  Patient states you guys keep giving me all these medications that I don't need."

## 2015-06-27 NOTE — [facility] Group Notes (Signed)
[facility] LCSW Aftercare Discharge Planning Group Note   06/27/2015 11:46 AM  Participation Quality:  Invited.  Chose to not attend    Mike [name]   

## 2015-06-27 NOTE — Progress Notes (Signed)
Patient ID: Oluwatosin Higginson, male   DOB: May 04, 1980, 35 y.o.   MRN: [id number] Patient ID: Jennifer Holland, male   DOB: [birth year], 35 y.o.   MRN: [id number] Patient ID: Jacquan Savas, male   DOB: [birth year], 35 y.o.   MRN: [id number] [facility] MD Progress Note  06/27/2015 2:07 PM Nomar Broad  MRN:  [id number]  Subjective: Kaydan states "I'm doing well, when am I getting discharged from here?"  Objective: Hance is seen, chart reviewed. He presents as calm & quiet during this follow-up care assessment. He says he came to the hospital because he was seeing ghost, on trying to throw something at the ghost, broke the wall. Today, he says he is doing well. He is visible on the unit. He is seen sometimes working up & down the hallways, no disruption on the unit. He appears to be in no apparent distress, However per staff - Mickle Mallory can present periods on the unit when he becomes irritable, explosive and he can be quiet unpredictable. Doss continues to need PRN medications as well as redirection on the unit. Pt with significant hx of disruptive behavior prior to admission. He is planning on getting a job after discharge. 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   Principal Problem: Schizoaffective disorder, bipolar type (HCC)  Diagnosis:   Patient Active Problem List   Diagnosis Date Noted  . Intermittent explosive disorder [F63.81] 06/24/2015  . Schizoaffective disorder, bipolar type (HCC) [F25.0] 06/23/2015   Total Time spent with patient: 15 minutes  Past Psychiatric History: Please see H&P.  Past Medical History:  Past Medical History  Diagnosis Date  . Schizophrenia (HCC)    History reviewed. No pertinent past surgical history.  Family History: Please see H&P.  Family Psychiatric  History: Please see H&P.  Social History: Please see H&P.  History  Alcohol Use  . Yes     History  Drug Use Not on file    Social History   Social History  . Marital Status: Single    Spouse Name: N/A  .  Number of Children: N/A  . Years of Education: N/A   Social History Main Topics  . Smoking status: Never Smoker   . Smokeless tobacco: None  . Alcohol Use: Yes  . Drug Use: None  . Sexual Activity: Not Asked   Other Topics Concern  . None   Social History Narrative   Additional Social History:   Sleep: Fair "improving"  Appetite:  Fair  Current Medications: Current Facility-Administered Medications  Medication Dose Route Frequency Provider Last Rate Last Dose  . acetaminophen (TYLENOL) tablet 650 mg  650 mg Oral Q4H PRN Craige Cotta, MD   650 mg at 06/27/15 1303  . alum & mag hydroxide-simeth (MAALOX/MYLANTA) 200-200-20 MG/5ML suspension 30 mL  30 mL Oral Q4H PRN Adonis Brook, NP      . haloperidol (HALDOL) tablet 2 mg  2 mg Oral QPC lunch Jomarie Longs, MD   2 mg at 06/27/15 1304   And  . benztropine (COGENTIN) tablet 0.5 mg  0.5 mg Oral QPC lunch Jomarie Longs, MD   0.5 mg at 06/27/15 1305  . benztropine (COGENTIN) tablet 0.5 mg  0.5 mg Oral Daily Saramma Eappen, MD   0.5 mg at 06/26/15 0825  . benztropine (COGENTIN) tablet 0.5 mg  0.5 mg Oral QPM Saramma Eappen, MD   0.5 mg at 06/26/15 1730  . haloperidol (HALDOL) tablet 10 mg  10 mg Oral QPM Saramma Eappen, MD   10 mg at  06/26/15 1723  . haloperidol (HALDOL) tablet 5 mg  5 mg Oral Daily Jomarie LongsSaramma Eappen, MD   5 mg at 06/26/15 0825  . ibuprofen (ADVIL,MOTRIN) tablet 600 mg  600 mg Oral Q8H PRN Adonis BrookSheila Agustin, NP   600 mg at 06/23/15 0815  . LORazepam (ATIVAN) tablet 1 mg  1 mg Oral Q6H PRN Jomarie LongsSaramma Eappen, MD       Or  . LORazepam (ATIVAN) injection 1 mg  1 mg Intramuscular Q6H PRN Jomarie LongsSaramma Eappen, MD      . magnesium hydroxide (MILK OF MAGNESIA) suspension 30 mL  30 mL Oral Daily PRN Adonis BrookSheila Agustin, NP      . nicotine (NICODERM CQ - dosed in mg/24 hours) patch 21 mg  21 mg Transdermal Daily Adonis BrookSheila Agustin, NP   21 mg at 06/27/15 0826  . ondansetron (ZOFRAN) tablet 4 mg  4 mg Oral Q8H PRN Adonis BrookSheila Agustin, NP      .  Oxcarbazepine (TRILEPTAL) tablet 300 mg  300 mg Oral Daily Jomarie LongsSaramma Eappen, MD   300 mg at 06/27/15 0824  . Oxcarbazepine (TRILEPTAL) tablet 600 mg  600 mg Oral QPM Saramma Eappen, MD   600 mg at 06/26/15 1730  . traZODone (DESYREL) tablet 125 mg  125 mg Oral QHS Jomarie LongsSaramma Eappen, MD   125 mg at 06/26/15 2132  . ziprasidone (GEODON) capsule 20 mg  20 mg Oral BID PRN Jomarie LongsSaramma Eappen, MD       Or  . ziprasidone (GEODON) injection 10 mg  10 mg Intramuscular BID PRN Jomarie LongsSaramma Eappen, MD       Lab Results:  No results found for this or any previous visit (from the past 48 hour(s)).  Blood Alcohol level:  Lab Results  Component Value Date   ETH <5 06/19/2015   Physical Findings: AIMS: Facial and Oral Movements Muscles of Facial Expression: None, normal Lips and Perioral Area: None, normal Jaw: None, normal Tongue: None, normal,Extremity Movements Upper (arms, wrists, hands, fingers): None, normal Lower (legs, knees, ankles, toes): None, normal, Trunk Movements Neck, shoulders, hips: None, normal, Overall Severity Severity of abnormal movements (highest score from questions above): None, normal Incapacitation due to abnormal movements: None, normal Patient's awareness of abnormal movements (rate only patient's report): No Awareness, Dental Status Current problems with teeth and/or dentures?: No Does patient usually wear dentures?: No  CIWA:  CIWA-Ar Total: 0 COWS:     Musculoskeletal: Strength & Muscle Tone: within normal limits Gait & Station: normal Patient leans: N/A  Psychiatric Specialty Exam: Physical Exam  Nursing note and vitals reviewed.   Review of Systems  Constitutional: Negative.   HENT: Negative.   Eyes: Negative.   Respiratory: Negative.   Cardiovascular: Negative.   Gastrointestinal: Negative.   Genitourinary: Negative.   Musculoskeletal: Negative.   Skin: Negative.   Neurological: Negative.   Endo/Heme/Allergies: Negative.   Psychiatric/Behavioral: Positive  for depression, hallucinations ("Improving') and substance abuse (Cannabis abuse). Negative for suicidal ideas and memory loss. The patient is nervous/anxious and has insomnia (Inproving).   All other systems reviewed and are negative.   Blood pressure 124/85, pulse 98, temperature 97.8 F (36.6 C), temperature source Oral, resp. rate 16, height 5\' 9"  (1.753 m), weight 99.791 kg (220 lb), SpO2 97 %.Body mass index is 32.47 kg/(m^2).  General Appearance: Guarded  Eye Contact:  Fair  Speech:  Normal Rate  Volume:  Decreased  Mood:  Anxious and Irritable  Affect:  Labile  Thought Process:  Goal Directed and Descriptions of Associations: Circumstantial  Orientation:  Full (Time, Place, and Person)  Thought Content:  Paranoid Ideation and Rumination  Suicidal Thoughts:  No  Homicidal Thoughts:  No  Memory:  Immediate;   Fair Recent;   Fair Remote;   Fair  Judgement:  Impaired  Insight:  Shallow  Psychomotor Activity:  Decreased, calm, quiet  Concentration:  Concentration: Fair and Attention Span: Fair  Recall:  [employer] of Knowledge:  Fair  Language:  Fair  Akathisia:  No  Handed:  Right  AIMS (if indicated):     Assets:  Desire for Improvement  ADL's:  Intact  Cognition:  WNL  Sleep:  Number of Hours: 6.5   Treatment Plan Summary: Patient continues to need medication readjustment for continued mood lability and paranoia and sleep. Will continue treatment.  Daily contact with patient to assess and evaluate symptoms and progress in treatment and Medication management  Reviewed past medical records, treatment plan.  Will continue Haldol 5 mg po daily, 2 mg po at lunch for mood control & 10 mg po q pm for psychosis. Will continue Cogentin 0.5 mg po tid for prevention of EPS. Will contionue Trilpetal 300 mg po daily and 600 mg po qpm for mood stabilization. Will continue Trazodone to 125 mg po qhs for sleep. Will make available prn medications as per agitation protocol. Will  continue to monitor vitals ,medication compliance and treatment side effects while patient is here.  Will monitor for medical issues as well as call consult as needed.  CSW will continue working on disposition.  Recreational therapy consult. Patient to participate in therapeutic milieu.  Continue current plan of care.  Sanjuana Kava, NP, PMHNP, FNP-BC 06/27/2015, 2:07 PM Agree with NP progress note as above

## 2015-06-27 NOTE — Progress Notes (Signed)
Patient ID: Mike [name], male   DOB: 28-May-1980, 35 y.o.   MRN: [id number] D: Client reports "day was good" chilled out, did groups, ate some lunch, talk to my girls (well they was outside playing) talk to the babies momma" Client reluctant to discuss if he asked the children's momma about coming back to stay  "we don't talk like that" "I just told her I miss her, she told me she knew I was going to get sick coming down here with no medical follow up" A: Writer provided emotional support, listening, encouraged client to speak about what concerns him. Writer noted during conversation, cllient can be easily agitated, conversation then changed to things he likes to do. "I'm an Tree surgeonartist, I like to draw" Client eventually get up for snack and calls children again. Medication reviewed, administered as ordered. Staff will monitor q1315min for safety. R: client is safe on the unit.

## 2015-06-28 NOTE — [facility] Group Notes (Signed)
[facility] LCSW Group Therapy Note  06/28/2015 10:00 AM  Type of Therapy and Topic:  Group Therapy: Avoiding Self-Sabotaging and Enabling Behaviors  Participation Level:  Did Not Attend although encouraged to do so    Catherine C Harrill, LCSW  

## 2015-06-28 NOTE — Progress Notes (Signed)
Mike [name] states his day was "good". His goal today was "to be more active and talk to staff and the people on the unit a little more". Rates Anxiety 7/10 Depression 8/10.  Denies SI/HI/AVH. Contracts for safety. Encouragement and support given. Medications administered as prescribed. Continue to monitor for patient safety and medication effectiveness.

## 2015-06-28 NOTE — [facility] Group Notes (Signed)
[facility] Group Notes:  (Nursing/MHT/Case Management/Adjunct)  Date:  06/28/2015  Time:  5:51 PM  Type of Therapy:  Nurse Education  Participation Level:  Did Not Attend   Mickie Baillizabeth O Iwenekha 06/28/2015, 5:51 PM

## 2015-06-28 NOTE — Progress Notes (Signed)
Mike [name] [facility] MD Progress Note  06/28/2015 1:39 PM Mike [name] [name]  MRN:  [id number]  Subjective: Mike [name] [name] states "I'm not feeling okay today, I am having one of those days"  Objective: Mike [name] [name] is awake, alert and oriented X4. Seen resting in bed. Denies suicidal or homicidal ideation. Denies auditory or visual hallucination and does not appear to be responding to internal stimuli. Patient reports interacting well with staff and others. Patient is flat and not engaged in this discussion.Patient reports he is medication compliant without mediation side effects.  States his depression 8/10. Patient states "I am just not feeling myself today."  Patient denies injury or illness. Reports good appetite and states he is resting well.  Support, encouragement and reassurance was provided.   Principal Problem: Schizoaffective disorder, bipolar type (HCC)  Diagnosis:   Patient Active Problem List   Diagnosis Date Noted  . Intermittent explosive disorder [F63.81] 06/24/2015  . Schizoaffective disorder, bipolar type (HCC) [F25.0] 06/23/2015   Total Time spent with patient: 15 minutes  Past Psychiatric History: Please see H&P.  Past Medical History:  Past Medical History  Diagnosis Date  . Schizophrenia (HCC)    History reviewed. No pertinent past surgical history.  Family History: Please see H&P.  Family Psychiatric  History: Please see H&P.  Social History: Please see H&P.  History  Alcohol Use  . Yes     History  Drug Use Not on file    Social History   Social History  . Marital Status: Single    Spouse Name: N/A  . Number of Children: N/A  . Years of Education: N/A   Social History Main Topics  . Smoking status: Never Smoker   . Smokeless tobacco: None  . Alcohol Use: Yes  . Drug Use: None  . Sexual Activity: Not Asked   Other Topics Concern  . None   Social History Narrative   Additional Social History:   Sleep: Fair   Appetite:  Fair  Current Medications: Current  Facility-Administered Medications  Medication Dose Route Frequency Provider Last Rate Last Dose  . acetaminophen (TYLENOL) tablet 650 mg  650 mg Oral Q4H PRN Craige Cotta, MD   650 mg at 06/27/15 1303  . alum & mag hydroxide-simeth (MAALOX/MYLANTA) 200-200-20 MG/5ML suspension 30 mL  30 mL Oral Q4H PRN Adonis Brook, NP      . haloperidol (HALDOL) tablet 2 mg  2 mg Oral QPC lunch Jomarie Longs, MD   2 mg at 06/28/15 1251   And  . benztropine (COGENTIN) tablet 0.5 mg  0.5 mg Oral QPC lunch Jomarie Longs, MD   0.5 mg at 06/28/15 1252  . benztropine (COGENTIN) tablet 0.5 mg  0.5 mg Oral Daily Saramma Eappen, MD   0.5 mg at 06/28/15 0815  . benztropine (COGENTIN) tablet 0.5 mg  0.5 mg Oral QPM Saramma Eappen, MD   0.5 mg at 06/27/15 1814  . haloperidol (HALDOL) tablet 10 mg  10 mg Oral QPM Saramma Eappen, MD   10 mg at 06/27/15 1814  . haloperidol (HALDOL) tablet 5 mg  5 mg Oral Daily Jomarie Longs, MD   5 mg at 06/28/15 0814  . ibuprofen (ADVIL,MOTRIN) tablet 600 mg  600 mg Oral Q8H PRN Adonis Brook, NP   600 mg at 06/23/15 0815  . LORazepam (ATIVAN) tablet 1 mg  1 mg Oral Q6H PRN Jomarie Longs, MD       Or  . LORazepam (ATIVAN) injection 1 mg  1 mg Intramuscular Q6H PRN Saramma  Eappen, MD      . magnesium hydroxide (MILK OF MAGNESIA) suspension 30 mL  30 mL Oral Daily PRN Adonis BrookSheila Agustin, NP      . nicotine (NICODERM CQ - dosed in mg/24 hours) patch 21 mg  21 mg Transdermal Daily Adonis BrookSheila Agustin, NP   21 mg at 06/28/15 0839  . ondansetron (ZOFRAN) tablet 4 mg  4 mg Oral Q8H PRN Adonis BrookSheila Agustin, NP      . Oxcarbazepine (TRILEPTAL) tablet 300 mg  300 mg Oral Daily Jomarie LongsSaramma Eappen, MD   300 mg at 06/28/15 0814  . Oxcarbazepine (TRILEPTAL) tablet 600 mg  600 mg Oral QPM Saramma Eappen, MD   600 mg at 06/27/15 1815  . traZODone (DESYREL) tablet 125 mg  125 mg Oral QHS Jomarie LongsSaramma Eappen, MD   125 mg at 06/27/15 2121  . ziprasidone (GEODON) capsule 20 mg  20 mg Oral BID PRN Jomarie LongsSaramma Eappen, MD        Or  . ziprasidone (GEODON) injection 10 mg  10 mg Intramuscular BID PRN Jomarie LongsSaramma Eappen, MD       Lab Results:  No results found for this or any previous visit (from the past 48 hour(s)).  Blood Alcohol level:  Lab Results  Component Value Date   ETH <5 06/19/2015   Physical Findings: AIMS: Facial and Oral Movements Muscles of Facial Expression: None, normal Lips and Perioral Area: None, normal Jaw: None, normal Tongue: None, normal,Extremity Movements Upper (arms, wrists, hands, fingers): None, normal Lower (legs, knees, ankles, toes): None, normal, Trunk Movements Neck, shoulders, hips: None, normal, Overall Severity Severity of abnormal movements (highest score from questions above): None, normal Incapacitation due to abnormal movements: None, normal Patient's awareness of abnormal movements (rate only patient's report): No Awareness, Dental Status Current problems with teeth and/or dentures?: No Does patient usually wear dentures?: No  CIWA:  CIWA-Ar Total: 0 COWS:     Musculoskeletal: Strength & Muscle Tone: within normal limits Gait & Station: normal Patient leans: N/A  Psychiatric Specialty Exam: Physical Exam  Nursing note and vitals reviewed. Constitutional: He is oriented to person, place, and time. He appears well-developed.  HENT:  Head: Normocephalic.  Cardiovascular: Normal rate.   Musculoskeletal: Normal range of motion.  Neurological: He is alert and oriented to person, place, and time.  Psychiatric: He has a normal mood and affect. His behavior is normal.    Review of Systems  Constitutional: Negative.   HENT: Negative.   Eyes: Negative.   Respiratory: Negative.   Cardiovascular: Negative.   Gastrointestinal: Negative.   Genitourinary: Negative.   Musculoskeletal: Negative.   Skin: Negative.   Neurological: Negative.   Endo/Heme/Allergies: Negative.   Psychiatric/Behavioral: Positive for depression, hallucinations ("Improving') and substance  abuse (Cannabis abuse). Negative for suicidal ideas and memory loss. The patient is nervous/anxious and has insomnia (Inproving).   All other systems reviewed and are negative.   Blood pressure 124/85, pulse 98, temperature 97.8 F (36.6 C), temperature source Oral, resp. rate 16, height 5\' 9"  (1.753 m), weight 99.791 kg (220 lb), SpO2 97 %.Body mass index is 32.47 kg/(m^2).  General Appearance: Guarded  Eye Contact:  Fair  Speech:  Normal Rate  Volume:  Decreased  Mood:  Anxious and Irritable  Affect:  Labile  Thought Process:  Goal Directed and Descriptions of Associations: Circumstantial  Orientation:  Full (Time, Place, and Person)  Thought Content:  Paranoid Ideation and Rumination  Suicidal Thoughts:  No  Homicidal Thoughts:  No  Memory:  Immediate;  Fair Recent;   Fair Remote;   Fair  Judgement:  Impaired  Insight:  Shallow  Psychomotor Activity:  Restlessness  Concentration:  Concentration: Fair and Attention Span: Fair  Recall:  [employer] of Knowledge:  Fair  Language:  Fair  Akathisia:  No  Handed:  Right  AIMS (if indicated):     Assets:  Desire for Improvement  ADL's:  Intact  Cognition:  WNL  Sleep:  Number of Hours: 6.5     I agree with current treatment plan on 06/28/2015, Patient seen face-to-face for psychiatric evaluation follow-up, chart reviewed.Reviewed the information documented and agree with the treatment plan.  Treatment Plan Summary:  Patient continues to need medication readjustment for continued mood lability and paranoia and sleep. Will continue treatment. Daily contact with patient to assess and evaluate symptoms and progress in treatment and Medication management  Reviewed past medical records, treatment plan.  Will continue Haldol 5 mg po daily, 2 mg po at lunch for mood control & 10 mg po q pm for psychosis. Will continue Cogentin 0.5 mg po tid for prevention of EPS. Will contionue Trilpetal 300 mg po daily and 600 mg po qpm for mood  stabilization. Will continue Trazodone to 125 mg po qhs for sleep. Will make available prn medications as per agitation protocol. Will continue to monitor vitals ,medication compliance and treatment side effects while patient is here.  Will monitor for medical issues as well as call consult as needed.  CSW will continue working on disposition.  Recreational therapy consult. Patient to participate in therapeutic milieu.  Continue current plan of care.  Oneta Rack, NP 06/28/2015, 1:39 PM I agreed with findings and treatment plan of this patient

## 2015-06-28 NOTE — Progress Notes (Signed)
DAR NOTE: Patient presents with anxious affect and depressed mood.  Denies pain, auditory and visual hallucinations.  Rates depression at 8, hopelessness at 8, and anxiety at 8.  Maintained on routine safety checks.  Medications given as prescribed.  Support and encouragement offered as needed.  Attended group and participated.  States goal for today is "living arrangement."  Patient remained isolative to his room.

## 2015-06-29 NOTE — Progress Notes (Signed)
DAR NOTE: Patient presents with anxious affect and depressed mood.  Denies pain, auditory and visual hallucinations.  Rates depression at 9, hopelessness at 6, and anxiety at 7.  Maintained on routine safety checks.  Medications given as prescribed.  Support and encouragement offered as needed.  Attended group and participated.   Patient pacing back and forth on the unit.  Patient voices concern regarding discharge and placement.

## 2015-06-29 NOTE — [facility] Group Notes (Signed)
[facility] LCSW Group Therapy  06/29/2015 10:30 AM  Type of Therapy:  Group Therapy  Participation Level:  Did Not Attend   Beverly SessionsLINDSEY, Makail Watling J 06/29/2015, 11:42 AM

## 2015-06-29 NOTE — Progress Notes (Signed)
Adult Psychoeducational Group Note  Date:  06/29/2015 Time:  9:11 PM  Group Topic/Focus:  Wrap-Up Group:   The focus of this group is to help patients review their daily goal of treatment and discuss progress on daily workbooks.  Participation Level:  Did Not Attend  Additional Comments:  Pt was encouraged to attend group, but did not.  Burman FreestoneCraddock, Honour Schwieger L 06/29/2015, 9:11 PM

## 2015-06-29 NOTE — Progress Notes (Signed)
[facility]  06/29/2015 11:31 AM Mike [name]  MRN:  [id number]  Subjective: Mike [name] states "I'm not feeling well, but I guess I will be okay"  Objective: Mike [name] is awake, alert and oriented X4. Seen resting in bed. Patient is flat and guarded with minimal eye contact.  Denies suicidal or homicidal ideation. Denies auditory or visual hallucination and does not appear to be responding to internal stimuli. Patient reports interacting well with staff and others. Patient reports he attending group on yesterday, however reports he is unable to recall what was discussed. Pt continues to be flat and unengaged in this discussion. Patient reports he is medication compliant without mediation side effects. States his depression 7/10.  Reports good appetite and states he is resting well. Support, encouragement and reassurance was provided.   Principal Problem: Schizoaffective disorder, bipolar type (HCC)  Diagnosis:   Patient Active Problem List   Diagnosis Date Noted  . Intermittent explosive disorder [F63.81] 06/24/2015  . Schizoaffective disorder, bipolar type (HCC) [F25.0] 06/23/2015   Total Time spent with patient: 15 minutes  Past Psychiatric History: Please see H&P.  Past Medical History:  Past Medical History  Diagnosis Date  . Schizophrenia (HCC)    History reviewed. No pertinent past surgical history.  Family History: Please see H&P.  Family Psychiatric  History: Please see H&P.  Social History: Please see H&P.  History  Alcohol Use  . Yes     History  Drug Use Not on file    Social History   Social History  . Marital Status: Single    Spouse Name: N/A  . Number of Children: N/A  . Years of Education: N/A   Social History Main Topics  . Smoking status: Never Smoker   . Smokeless tobacco: None  . Alcohol Use: Yes  . Drug Use: None  . Sexual Activity: Not Asked   Other Topics Concern  . None   Social History Narrative   Additional Social  History:   Sleep: Fair   Appetite:  Fair  Current Medications: Current Facility-Administered Medications  Medication Dose Route Frequency Provider Last Rate Last Dose  . acetaminophen (TYLENOL) tablet 650 mg  650 mg Oral Q4H PRN Craige CottaFernando A Cobos, MD   650 mg at 06/27/15 1303  . alum & mag hydroxide-simeth (MAALOX/MYLANTA) 200-200-20 MG/5ML suspension 30 mL  30 mL Oral Q4H PRN Adonis BrookSheila Agustin, NP      . haloperidol (HALDOL) tablet 2 mg  2 mg Oral QPC lunch Jomarie LongsSaramma Eappen, MD   2 mg at 06/28/15 1251   And  . benztropine (COGENTIN) tablet 0.5 mg  0.5 mg Oral QPC lunch Jomarie LongsSaramma Eappen, MD   0.5 mg at 06/28/15 1252  . benztropine (COGENTIN) tablet 0.5 mg  0.5 mg Oral Daily Jomarie LongsSaramma Eappen, MD   0.5 mg at 06/29/15 0816  . benztropine (COGENTIN) tablet 0.5 mg  0.5 mg Oral QPM Saramma Eappen, MD   0.5 mg at 06/28/15 1723  . haloperidol (HALDOL) tablet 10 mg  10 mg Oral QPM Saramma Eappen, MD   10 mg at 06/28/15 1722  . haloperidol (HALDOL) tablet 5 mg  5 mg Oral Daily Jomarie LongsSaramma Eappen, MD   5 mg at 06/29/15 0816  . ibuprofen (ADVIL,MOTRIN) tablet 600 mg  600 mg Oral Q8H PRN Adonis BrookSheila Agustin, NP   600 mg at 06/23/15 0815  . LORazepam (ATIVAN) tablet 1 mg  1 mg Oral Q6H PRN Jomarie LongsSaramma Eappen, MD       Or  .  LORazepam (ATIVAN) injection 1 mg  1 mg Intramuscular Q6H PRN Jomarie Longs, MD      . magnesium hydroxide (MILK OF MAGNESIA) suspension 30 mL  30 mL Oral Daily PRN Adonis Brook, NP      . nicotine (NICODERM CQ - dosed in mg/24 hours) patch 21 mg  21 mg Transdermal Daily Adonis Brook, NP   21 mg at 06/29/15 1478  . ondansetron (ZOFRAN) tablet 4 mg  4 mg Oral Q8H PRN Adonis Brook, NP      . Oxcarbazepine (TRILEPTAL) tablet 300 mg  300 mg Oral Daily Jomarie Longs, MD   300 mg at 06/29/15 0816  . Oxcarbazepine (TRILEPTAL) tablet 600 mg  600 mg Oral QPM Saramma Eappen, MD   600 mg at 06/28/15 1723  . traZODone (DESYREL) tablet 125 mg  125 mg Oral QHS Jomarie Longs, MD   125 mg at 06/28/15 2202  .  ziprasidone (GEODON) capsule 20 mg  20 mg Oral BID PRN Jomarie Longs, MD       Or  . ziprasidone (GEODON) injection 10 mg  10 mg Intramuscular BID PRN Jomarie Longs, MD       Lab Results:  No results found for this or any previous visit (from the past 48 hour(s)).  Blood Alcohol level:  Lab Results  Component Value Date   ETH <5 06/19/2015   Physical Findings: AIMS: Facial and Oral Movements Muscles of Facial Expression: None, normal Lips and Perioral Area: None, normal Jaw: None, normal Tongue: None, normal,Extremity Movements Upper (arms, wrists, hands, fingers): None, normal Lower (legs, knees, ankles, toes): None, normal, Trunk Movements Neck, shoulders, hips: None, normal, Overall Severity Severity of abnormal movements (highest score from questions above): None, normal Incapacitation due to abnormal movements: None, normal Patient's awareness of abnormal movements (rate only patient's report): No Awareness, Dental Status Current problems with teeth and/or dentures?: No Does patient usually wear dentures?: No  CIWA:  CIWA-Ar Total: 0 COWS:     Musculoskeletal: Strength & Muscle Tone: within normal limits Gait & Station: normal Patient leans: N/A  Psychiatric Specialty Exam: Physical Exam  Nursing note and vitals reviewed. Constitutional: He is oriented to person, place, and time. He appears well-developed.  HENT:  Head: Normocephalic.  Cardiovascular: Normal rate.   Musculoskeletal: Normal range of motion.  Neurological: He is alert and oriented to person, place, and time.  Psychiatric: He has a normal mood and affect. His behavior is normal.    Review of Systems  Constitutional: Negative.   HENT: Negative.   Eyes: Negative.   Respiratory: Negative.   Cardiovascular: Negative.   Gastrointestinal: Negative.   Genitourinary: Negative.   Musculoskeletal: Negative.   Skin: Negative.   Neurological: Negative.   Endo/Heme/Allergies: Negative.    Psychiatric/Behavioral: Positive for depression, hallucinations ("Improving') and substance abuse (Cannabis abuse). Negative for suicidal ideas and memory loss. The patient is nervous/anxious and has insomnia (Inproving).   All other systems reviewed and are negative.   Blood pressure 124/85, pulse 98, temperature 97.8 F (36.6 C), temperature source Oral, resp. rate 16, height 5\' 9"  (1.753 m), weight 99.791 kg (220 lb), SpO2 97 %.Body mass index is 32.47 kg/(m^2).  General Appearance: Casual and Guarded  Eye Contact:  Fair  Speech:  Normal Rate  Volume:  Decreased  Mood:  Anxious and Irritable  Affect:  Labile  Thought Process:  Goal Directed and Descriptions of Associations: Circumstantial  Orientation:  Full (Time, Place, and Person)  Thought Content:  Paranoid Ideation and Rumination  Suicidal Thoughts:  No  Homicidal Thoughts:  No  Memory:  Immediate;   Fair Recent;   Fair Remote;   Fair  Judgement:  Impaired  Insight:  Shallow  Psychomotor Activity:  Restlessness  Concentration:  Concentration: Fair and Attention Span: Fair  Recall:  [employer] of Knowledge:  Fair  Language:  Fair  Akathisia:  No  Handed:  Right  AIMS (if indicated):     Assets:  Desire for Improvement  ADL's:  Intact  Cognition:  WNL  Sleep:  Number of Hours: 6.75     I agree with current treatment plan on 06/29/2015, Patient seen face-to-face for psychiatric evaluation follow-up, chart reviewed.Reviewed the information documented and agree with the treatment plan.  Treatment Plan Summary:  Patient continues to need medication readjustment for continued mood lability and paranoia and sleep. Will continue treatment. Daily contact with patient to assess and evaluate symptoms and progress in treatment and Medication management  Reviewed past medical records, treatment plan.  Will continue Haldol 5 mg po daily, 2 mg po at lunch for mood control & 10 mg po q pm for psychosis. Will continue Cogentin 0.5  mg po tid for prevention of EPS. Will contionue Trilpetal 300 mg po daily and 600 mg po qpm for mood stabilization. Will continue Trazodone to 125 mg po qhs for sleep. Will make available prn medications as per agitation protocol. Will continue to monitor vitals ,medication compliance and treatment side effects while patient is here.  Will monitor for medical issues as well as call consult as needed.  CSW will continue working on disposition.  Recreational therapy consult. Patient to participate in therapeutic milieu.  Continue current plan of care.  Oneta Rack, NP 06/29/2015, 11:31 AM I agreed with findings and treatment plan of this patient

## 2015-06-30 MED ORDER — HALOPERIDOL DECANOATE 100 MG/ML IM SOLN
100.0000 mg | INTRAMUSCULAR | Status: DC
  Administered 2015-06-30: 100 mg via INTRAMUSCULAR
  Filled 2015-06-30: qty 1

## 2015-06-30 MED ORDER — HALOPERIDOL 2 MG PO TABS
7.0000 mg | ORAL_TABLET | Freq: Every day | ORAL | Status: DC
  Administered 2015-07-01: 7 mg via ORAL
  Filled 2015-06-30 (×2): qty 1

## 2015-06-30 NOTE — Progress Notes (Signed)
Recreation Therapy Notes  06.19.2017 approximately 3:30pm. Patient reports no need for any additional recreation therapy services at this time, stating he intends on taking advantage of ACTT team post d/c.          Jearl KlinefelterBlanchfield, Brittie Whisnant L 06/30/2015 3:46 PM

## 2015-06-30 NOTE — Progress Notes (Signed)
D. Pt has been in his room and in bed for majority of the evening, pt did not attend evening group activity. Pt did endorse some feelings of depression this evening and spoke about how he wanted to get a good nights sleep. Pt did receive bedtime medications without incident and did not verbalize any complaints of pain. A. Support and encouragement provided. R. Safety maintained, will continue to monitor.

## 2015-06-30 NOTE — Progress Notes (Signed)
D:Patient in the hallway on approach.  Patient states he had a good day today.  Patient denies SI/HI and denies AVH.  Patiemt states he has been sleeping a lot.  Patient is calm and cooperative during assessment but has an inappropriate smile when speaking to Clinical research associatewriter. A: Staff to monitor Q 15 mins for safety.  Encouragement and support offered.  Scheduled medications administered per orders. R: Patient remains safe on the unit.  Patient did not attend group tonight.  Patient was briefly viisble on the unit.  Patient taking administered medications.

## 2015-06-30 NOTE — Progress Notes (Signed)
DAR NOTE: Pt present with flat affect and depressed mood in the unit. Pt has been isolating himself and has been bed most of the time time today. Pt stated he 's been tired and feels like some of his medications are making him sleepy. Pt denies physical pain, took all his meds as scheduled. As per self inventory, pt had a good night sleep, good appetite, low energy, poor good concentration. Pt rate depression at 10, hopeless ness at 8, and anxiety at 5. Pt's safety ensured with 15 minute and environmental checks. Pt currently denies SI/HI and A/V hallucinations. Pt verbally agrees to seek staff if SI/HI or A/VH occurs and to consult with staff before acting on these thoughts. Will continue POC.

## 2015-06-30 NOTE — Progress Notes (Signed)
Pt is moved back to 500 unit, not problem encounter. Pt tolerated well, will keep on monitoring.

## 2015-06-30 NOTE — [facility] Group Notes (Signed)
[facility] LCSW Group Therapy  06/30/2015 1:15 pm  Type of Therapy: Process Group Therapy  Participation Level:  Active  Participation Quality:  Appropriate  Affect:  Flat  Cognitive:  Oriented  Insight:  Improving  Engagement in Group:  Limited  Engagement in Therapy:  Limited  Modes of Intervention:  Activity, Clarification, Education, Problem-solving and Support  Summary of Progress/Problems: Today's group addressed the issue of overcoming obstacles.  Patients were asked to identify their biggest obstacle post d/c that stands in the way of their on-going success, and then problem solve as to how to manage this. Invited.  Chose to not attend.  Mike [name] B 06/30/2015   3:51 PM   

## 2015-06-30 NOTE — Progress Notes (Signed)
[facility]  06/30/2015 12:54 PM Nat ChristenDonnell Andersson  MRN:  [id number]  Subjective: Maxxwell states "I'm ok."   Objective: Nat Christenonnell Orlowski is awake, alert and oriented X4.  Dyon avoids eye contact , is vaguely irritable , observed as giving short answers and not being able to discuss his progress or needs. Per staff - he continues to have periods on the unit when he is observed as labile and irritable - requiring redirection on the unit. Pt denies any SI - however , given his hx of aggression as well as legal issues and diagnosis of IED - will need continued medication readjustment and observation on the unit.     Principal Problem: Schizoaffective disorder, bipolar type (HCC)  Diagnosis:   Patient Active Problem List   Diagnosis Date Noted  . Intermittent explosive disorder [F63.81] 06/24/2015  . Schizoaffective disorder, bipolar type (HCC) [F25.0] 06/23/2015   Total Time spent with patient: 25 minutes  Past Psychiatric History: Please see H&P.  Past Medical History:  Past Medical History  Diagnosis Date  . Schizophrenia (HCC)    History reviewed. No pertinent past surgical history.  Family History: Please see H&P.  Family Psychiatric  History: Please see H&P.  Social History: Please see H&P.  History  Alcohol Use  . Yes     History  Drug Use Not on file    Social History   Social History  . Marital Status: Single    Spouse Name: N/A  . Number of Children: N/A  . Years of Education: N/A   Social History Main Topics  . Smoking status: Never Smoker   . Smokeless tobacco: None  . Alcohol Use: Yes  . Drug Use: None  . Sexual Activity: Not Asked   Other Topics Concern  . None   Social History Narrative   Additional Social History:   Sleep: Fair   Appetite:  Fair  Current Medications: Current Facility-Administered Medications  Medication Dose Route Frequency Provider Last Rate Last Dose  . acetaminophen (TYLENOL) tablet 650 mg  650 mg Oral Q4H  PRN Craige CottaFernando A Cobos, MD   650 mg at 06/27/15 1303  . alum & mag hydroxide-simeth (MAALOX/MYLANTA) 200-200-20 MG/5ML suspension 30 mL  30 mL Oral Q4H PRN Adonis BrookSheila Agustin, NP      . haloperidol (HALDOL) tablet 2 mg  2 mg Oral QPC lunch Jomarie LongsSaramma Kelcy Laible, MD   2 mg at 06/30/15 1253   And  . benztropine (COGENTIN) tablet 0.5 mg  0.5 mg Oral QPC lunch Jomarie LongsSaramma Chang Tiggs, MD   0.5 mg at 06/29/15 1313  . benztropine (COGENTIN) tablet 0.5 mg  0.5 mg Oral Daily Shahiem Bedwell, MD   0.5 mg at 06/30/15 1017  . benztropine (COGENTIN) tablet 0.5 mg  0.5 mg Oral QPM Avalynn Bowe, MD   0.5 mg at 06/29/15 1720  . haloperidol (HALDOL) tablet 10 mg  10 mg Oral QPM Delia Slatten, MD   10 mg at 06/29/15 1719  . [START ON 07/01/2015] haloperidol (HALDOL) tablet 7 mg  7 mg Oral Daily Fields Oros, MD      . ibuprofen (ADVIL,MOTRIN) tablet 600 mg  600 mg Oral Q8H PRN Adonis BrookSheila Agustin, NP   600 mg at 06/23/15 0815  . LORazepam (ATIVAN) tablet 1 mg  1 mg Oral Q6H PRN Jomarie LongsSaramma Iisha Soyars, MD       Or  . LORazepam (ATIVAN) injection 1 mg  1 mg Intramuscular Q6H PRN Jomarie LongsSaramma Virlee Stroschein, MD      . magnesium hydroxide (MILK  OF MAGNESIA) suspension 30 mL  30 mL Oral Daily PRN Adonis Brook, NP      . nicotine (NICODERM CQ - dosed in mg/24 hours) patch 21 mg  21 mg Transdermal Daily Adonis Brook, NP   21 mg at 06/30/15 1016  . ondansetron (ZOFRAN) tablet 4 mg  4 mg Oral Q8H PRN Adonis Brook, NP      . Oxcarbazepine (TRILEPTAL) tablet 300 mg  300 mg Oral Daily Deberah Adolf, MD   300 mg at 06/30/15 1016  . Oxcarbazepine (TRILEPTAL) tablet 600 mg  600 mg Oral QPM Leiann Sporer, MD   600 mg at 06/29/15 1718  . traZODone (DESYREL) tablet 125 mg  125 mg Oral QHS Jomarie Longs, MD   125 mg at 06/29/15 2115  . ziprasidone (GEODON) capsule 20 mg  20 mg Oral BID PRN Jomarie Longs, MD       Or  . ziprasidone (GEODON) injection 10 mg  10 mg Intramuscular BID PRN Jomarie Longs, MD       Lab Results:  No results found for this or any  previous visit (from the past 48 hour(s)).  Blood Alcohol level:  Lab Results  Component Value Date   ETH <5 06/19/2015   Physical Findings: AIMS: Facial and Oral Movements Muscles of Facial Expression: None, normal Lips and Perioral Area: None, normal Jaw: None, normal Tongue: None, normal,Extremity Movements Upper (arms, wrists, hands, fingers): None, normal Lower (legs, knees, ankles, toes): None, normal, Trunk Movements Neck, shoulders, hips: None, normal, Overall Severity Severity of abnormal movements (highest score from questions above): None, normal Incapacitation due to abnormal movements: None, normal Patient's awareness of abnormal movements (rate only patient's report): No Awareness, Dental Status Current problems with teeth and/or dentures?: No Does patient usually wear dentures?: No  CIWA:  CIWA-Ar Total: 0 COWS:     Musculoskeletal: Strength & Muscle Tone: within normal limits Gait & Station: normal Patient leans: N/A  Psychiatric Specialty Exam: Physical Exam  Nursing note and vitals reviewed. Constitutional: He is oriented to person, place, and time. He appears well-developed.  HENT:  Head: Normocephalic.  Cardiovascular: Normal rate.   Musculoskeletal: Normal range of motion.  Neurological: He is alert and oriented to person, place, and time.  Psychiatric: He has a normal mood and affect. His behavior is normal.    Review of Systems  Constitutional: Negative.   HENT: Negative.   Eyes: Negative.   Respiratory: Negative.   Cardiovascular: Negative.   Gastrointestinal: Negative.   Genitourinary: Negative.   Musculoskeletal: Negative.   Skin: Negative.   Neurological: Negative.   Endo/Heme/Allergies: Negative.   Psychiatric/Behavioral: Positive for depression, hallucinations ("Improving') and substance abuse (Cannabis abuse). Negative for suicidal ideas and memory loss. The patient is nervous/anxious and has insomnia (Inproving).   All other systems  reviewed and are negative.   Blood pressure 129/105, pulse 75, temperature 98.6 F (37 C), temperature source Oral, resp. rate 20, height  (1.753 m), weight 99.791 kg (220 lb), SpO2 97 %.Body mass index is 32.47 kg/(m^2).  General Appearance: Casual and Guarded  Eye Contact:  Fair  Speech:  Normal Rate but limited  Volume:  Decreased  Mood:  Anxious and Irritable on and off   Affect:  Labile  Thought Process:  Goal Directed and Descriptions of Associations: Circumstantial  Orientation:  Full (Time, Place, and Person)  Thought Content:  Paranoid Ideation and Rumination improving  Suicidal Thoughts:  No  Homicidal Thoughts:  No  Memory:  Immediate;  Fair Recent;   Fair Remote;   Fair  Judgement:  Impaired  Insight:  Shallow  Psychomotor Activity:  Restlessness  Concentration:  Concentration: Fair and Attention Span: Fair  Recall:  [employer] of Knowledge:  Fair  Language:  Fair  Akathisia:  No  Handed:  Right  AIMS (if indicated):     Assets:  Desire for Improvement  ADL's:  Intact  Cognition:  WNL  Sleep:  Number of Hours: 7.25      Treatment Plan Summary:Pt with hx of severe aggression, disruptive behavior in the past - continues to need treatment.   Patient continues to need medication readjustment for continued mood lability and paranoia and sleep. Will continue treatment. Daily contact with patient to assess and evaluate symptoms and progress in treatment and Medication management  Reviewed past medical records, treatment plan.  Will increase Haldol to 7 mg po daily, 2 mg po at lunch for mood control & 10 mg po q pm for psychosis.Will offer Haldol decanoate 100 mg IM today - 06/30/15- repeat q28 days . Will continue Cogentin 0.5 mg po tid for prevention of EPS. Will contionue Trilpetal 300 mg po daily and 600 mg po qpm for mood stabilization. Will continue Trazodone to 125 mg po qhs for sleep. Will make available prn medications as per agitation protocol. Will  continue to monitor vitals ,medication compliance and treatment side effects while patient is here.  Will monitor for medical issues as well as call consult as needed.  CSW will continue working on disposition.  Recreational therapy consult.see notes. Patient to participate in therapeutic milieu.   Caydn Justen, MD 06/30/2015, 12:54 PM

## 2015-07-01 ENCOUNTER — Encounter (HOSPITAL_COMMUNITY): Payer: Self-pay | Admitting: Psychiatry

## 2015-07-01 MED ORDER — OXCARBAZEPINE 300 MG PO TABS: ORAL_TABLET | ORAL | Status: DC

## 2015-07-01 MED ORDER — HALOPERIDOL 1 MG PO TABS: 7.0000 mg | ORAL_TABLET | Freq: Every day | ORAL | Status: AC

## 2015-07-01 MED ORDER — NICOTINE 21 MG/24HR TD PT24: 21.0000 mg | MEDICATED_PATCH | Freq: Every day | TRANSDERMAL | Status: AC

## 2015-07-01 MED ORDER — HALOPERIDOL DECANOATE 100 MG/ML IM SOLN: 100.0000 mg | INTRAMUSCULAR | Status: AC

## 2015-07-01 MED ORDER — HALOPERIDOL 10 MG PO TABS: 10.0000 mg | ORAL_TABLET | Freq: Every evening | ORAL | Status: DC

## 2015-07-01 MED ORDER — TRAZODONE HCL 150 MG PO TABS: 125.0000 mg | ORAL_TABLET | Freq: Every day | ORAL | Status: DC

## 2015-07-01 MED ORDER — BENZTROPINE MESYLATE 0.5 MG PO TABS
ORAL_TABLET | ORAL | Status: DC
Start: 2015-07-01 — End: 2015-08-24

## 2015-07-01 NOTE — Tx Team (Signed)
Interdisciplinary Treatment Plan Update (Adult)  Date:  07/01/2015   Time Reviewed:  10:57 AM   Progress in Treatment: Attending groups: No Participating in groups:  No Taking medication as prescribed:  Yes. Tolerating medication:  Yes. Family/Significant other contact made:  Yes Patient understands diagnosis:  Yes  As evidenced by seeking help with "my anger" Discussing patient identified problems/goals with staff:  Yes, see initial care plan. Medical problems stabilized or resolved:  Yes. Denies suicidal/homicidal ideation: Yes. Issues/concerns per patient self-inventory:  No. Other:  New problem(s) identified:  Discharge Plan or Barriers: see below  Reason for Continuation of Hospitalization:   Comments:   Pt reports he moved to [name] ago. He identifies conflicts with his brother as his primary stressor. Pt says he is gay and that he and his brother "have different lifestyles." Pt reports he is on disability and his brother receives Pt's disability check. Pt states he would like to live independently. Pt reports there is a family history of mental health problems on both sides of his family. He denies current legal problems.  Pt reports he is prescribed haldol, cogentin and benadryl. Pt says he is taking his medications but cannot say when he last took them. Pt acknowledges he has been psychiatrically hospitalized in the past but cannot provide any details. Pt reports he went to [name] to establish a local mental health provider but the wait was too long so he left. Patient continues to need medication readjustment for continued mood lability and paranoia and sleep. Will continue treatment.  Daily contact with patient to assess and evaluate symptoms and progress in treatment and Medication management   Reviewed past medical records,treatment plan.  Will continue Haldol 5 mg po daily and increase to 10 mg po qpm for psychosis. Will continue  Cogentin 0.5 mg po bid for EPS. Will continue Trilpetal 300 mg po bid for mood sx. Will increase Trazodone to 125 mg po qhs for sleep.  06/26/15: Will continue Haldol 5 mg po daily, 2 mg po at lunch and 10 mg po q pm for psychosis. Will continue Cogentin 0.5 mg po tid for EPS. Will contionue Trilpetal 300 mg po daily and 600 mg po qpm for mood sx. Will continue Trazodone to 125 mg po qhs for sleep.  Estimated length of stay: D/C today  New goal(s):  Review of initial/current patient goals per problem list:   Review of initial/current patient goals per problem list:  1. Goal(s): Patient will participate in aftercare plan   Met: Yes   Target date: 3-5 days post admission date   As evidenced by: Patient will participate within aftercare plan AEB aftercare provider and housing plan at discharge being identified. 06/23/15:  Unclear at this point.  We will proceed under the assumption that brother will allow him to return home when stabilized on meds.  Follow up Mental Health 06/26/15: Brother will not allow pt to return home.  Sister will not allow pt to return to [name].  Since mother died last fall, pt has been in and out of shelters, and living pillar to post Have talked to pt about options, he is weighing them 07/01/15:  Will go to [name] today, follow up [employer]      5. Goal(s): Patient will demonstrate decreased signs of psychosis  * Met: Yes  * Target date: 3-5 days post admission date  * As evidenced by: Patient will demonstrate decreased frequency of AVH or return to  baseline function 06/23/15:  C/O command hallucinations prior to admission. 06/26/15:  No signs nor symptoms of psychosis today    6. Goal (s): Patient will demonstrate decreased signs of mania  * Met: Yes  * Target date: 3-5 days post admission date  * As evidenced by: Patient demonstrate decreased signs of mania AEB decreased mood instability and return to baseline functioning 06/23/15:  Pt  is easily agitated leading to destruction of property.  He has not been compliant with medications. 06/26/15: Has been compliant with meds here; no outbursts nor aggressive episodes 07/01/15:  No signs nor symptoms of mania today     Attendees: Patient:  07/01/2015 10:57 AM   Family:   07/01/2015 10:57 AM   Physician:  Ursula Alert, MD 07/01/2015 10:57 AM   Nursing:   Phillis Haggis, RN 07/01/2015 10:57 AM   CSW:    Roque Lias, LCSW   07/01/2015 10:57 AM   Other:  07/01/2015 10:57 AM   Other:   07/01/2015 10:57 AM   Other:  Lars Pinks, Nurse CM 07/01/2015 10:57 AM   Other:   07/01/2015 10:57 AM   Other:  Norberto Sorenson, [place]  07/01/2015 10:57 AM   Other:  07/01/2015 10:57 AM   Other:  07/01/2015 10:57 AM   Other:  07/01/2015 10:57 AM   Other:  07/01/2015 10:57 AM   Other:  07/01/2015 10:57 AM   Other:   07/01/2015 10:57 AM    Scribe for Treatment Team:   Trish Mage, 07/01/2015 10:57 AM
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
## 2015-07-01 NOTE — [facility] Suicide Risk Assessment (Signed)
[facility]   Principal Problem: Schizoaffective disorder, bipolar type [facility](HCC) Discharge Diagnoses:  Patient Active Problem List   Diagnosis Date Noted  . Intermittent explosive disorder [F63.81] 06/24/2015  . Schizoaffective disorder, bipolar type (HCC) [F25.0] 06/23/2015    Total Time spent with patient: 30 minutes  Musculoskeletal: Strength & Muscle Tone: within normal limits Gait & Station: normal Patient leans: N/A  Psychiatric Specialty Exam: Review of Systems  Psychiatric/Behavioral: Negative for depression and hallucinations.  All other systems reviewed and are negative.   Blood pressure 129/105, pulse 75, temperature 98.6 F (37 C), temperature source Oral, resp. rate 20, height 5\' 9"  (1.753 m), weight 99.791 kg (220 lb), SpO2 97 %.Body mass index is 32.47 kg/(m^2).  General Appearance: Casual  Eye Contact::  Fair  Speech:  Normal Rate409  Volume:  Normal  Mood:  Euthymic  Affect:  Congruent and Full Range  Thought Process:  Goal Directed and Descriptions of Associations: Intact  Orientation:  Full (Time, Place, and Person)  Thought Content:  Logical  Suicidal Thoughts:  No  Homicidal Thoughts:  No  Memory:  Immediate;   Fair Recent;   Fair Remote;   Fair  Judgement:  Fair  Insight:  Fair  Psychomotor Activity:  Normal  Concentration:  Fair  Recall:  F[employer]Fair  Fund of Knowledge:Fair  Language: Fair  Akathisia:  No  Handed:  Right  AIMS (if indicated):   0  Assets:  Communication Skills Desire for Improvement Physical Health  Sleep:  Number of Hours: 6.75  Cognition: WNL  ADL's:  Intact   Mental Status Per Nursing Assessment::   On Admission:  NA  Demographic Factors:  Male  Loss Factors: NA  Historical Factors: Impulsivity  Risk Reduction Factors:   Positive therapeutic relationship and Positive coping skills or problem solving skills  Continued Clinical Symptoms:  Alcohol/Substance Abuse/Dependencies Previous  Psychiatric Diagnoses and Treatments  Cognitive Features That Contribute To Risk:  None    Suicide Risk:  Minimal: No identifiable suicidal ideation.  Patients presenting with no risk factors but with morbid ruminations; may be classified as minimal risk based on the severity of the depressive symptoms    Plan Of Care/Follow-up recommendations:  Activity:  no restrictions Diet:  regular Tests:  as needed Other:  after care as scheduled- Haldol decanoate IM 100 mg - first dose -06/30/15  Price Lachapelle, MD 07/01/2015, 9:34 AM

## 2015-07-01 NOTE — Progress Notes (Signed)
Patient ID: [name], male   DOB: [birth year], 35 y.o.   MRN: [id number]  Adult Psychoeducational Group Note  Date:  07/01/2015 Time:09:30am  Group Topic/Focus:  Recovery Goals:   The focus of this group is to identify appropriate goals for recovery and establish a plan to achieve them.  Participation Level:  Did Not Attend  Participation Quality:  n/a  Affect:  n/a  Cognitive: n/a  Insight: n/a  Engagement in Group: n/a  Modes of Intervention:  n/a  Additional Comments:  Pt did not attend group, pt in bed asleep.   [name], [name] 07/01/2015, 10:20 AM

## 2015-07-01 NOTE — Progress Notes (Signed)
  [facility]  Will you be returning to the same living situation after discharge:  No. At discharge, do you have transportation home?: Yes,  bus pass Do you have the ability to pay for your medications: Yes,  MCD  Release of information consent forms completed and in the chart;  Patient's signature needed at discharge.  Patient to Follow up at: Follow-up Information    [facility]   Specialty:  Behavioral Health   Why:  Go to the walk-in clinic M-F between 8 and 11AM for your hospital follow up appointment   Contact information:   [address] [place] [address] [phone]       Next level of care provider [facility] Link:no  Safety Planning and Suicide Prevention discussed: Yes,  yes  Have you used any form of tobacco in the last 30 days? (Cigarettes, Smokeless Tobacco, Cigars, and/or Pipes): No  Has patient been referred to the Quitline?: N/A patient is not a smoker  Patient has been referred for addiction treatment: Yes  Daryel Geraldorth, Tessa Seaberry B 07/01/2015, 10:59 AM

## 2015-07-01 NOTE — Progress Notes (Signed)
Patient ID: [facility] [name], male   DOB: [birth year], 35 y.o.   MRN: [id number]   Pt currently presents with a pleasant affect and guarded behavior. Pt adheres to medication administration, reports increased sedation this morning. Pt maintains pleasant interaction with peers and staff. Pt reports he will be leaving today for a shelter.   Pt provided with medications per providers orders. Pt's labs and vitals were monitored throughout the day. Pt supported emotionally and encouraged to express concerns and questions. Pt educated on medications and suicide prevention resources.   Pt's safety ensured with 15 minute and environmental checks. Pt currently denies SI/HI and A/V hallucinations. Pt verbally agrees to seek staff if SI/HI or A/VH occurs and to consult with staff before acting on any harmful thoughts. Pt to be discharged today per MD. Will continue POC.

## 2015-07-01 NOTE — Progress Notes (Signed)
Pt. D/C from the unit to lobby with bus pass.  He was pleasant and cooperative. He voiced no SI/HI or A/V halluciations. He denies any pain or discomfort.  D/C instructions and medications reviewed with pt.  Pt. verbalized understanding of medications and d/c instructions.   All belongings from locker 14 returned to pt. Q 15 min checks maintained until discharge. He left the unit in no apparent distress.

## 2015-08-23 ENCOUNTER — Emergency Department (HOSPITAL_COMMUNITY)
Admission: EM | Admit: 2015-08-23 | Discharge: 2015-08-24 | Disposition: A | Discharge status: Home / Self Care | Payer: Medicaid Other | Attending: Emergency Medicine | Admitting: Emergency Medicine

## 2015-08-23 ENCOUNTER — Encounter (HOSPITAL_COMMUNITY): Payer: Self-pay | Admitting: Emergency Medicine

## 2015-08-23 ENCOUNTER — Emergency Department (HOSPITAL_COMMUNITY): Payer: Medicaid Other

## 2015-08-23 DIAGNOSIS — F259 Schizoaffective disorder, unspecified: Secondary | ICD-10-CM | POA: Diagnosis not present

## 2015-08-23 DIAGNOSIS — F17210 Nicotine dependence, cigarettes, uncomplicated: Secondary | ICD-10-CM | POA: Insufficient documentation

## 2015-08-23 DIAGNOSIS — R45851 Suicidal ideations: Secondary | ICD-10-CM | POA: Diagnosis present

## 2015-08-23 DIAGNOSIS — F6381 Intermittent explosive disorder: Secondary | ICD-10-CM | POA: Diagnosis not present

## 2015-08-23 DIAGNOSIS — F250 Schizoaffective disorder, bipolar type: Secondary | ICD-10-CM | POA: Diagnosis not present

## 2015-08-23 DIAGNOSIS — Z79899 Other long term (current) drug therapy: Secondary | ICD-10-CM | POA: Insufficient documentation

## 2015-08-23 LAB — RAPID URINE DRUG SCREEN, HOSP PERFORMED
Amphetamines: NOT DETECTED
Barbiturates: NOT DETECTED
Benzodiazepines: NOT DETECTED
Cocaine: NOT DETECTED
OPIATES: NOT DETECTED
TETRAHYDROCANNABINOL: POSITIVE — AB

## 2015-08-23 LAB — COMPREHENSIVE METABOLIC PANEL
ALK PHOS: 44 U/L (ref 38–126)
ALT: 22 U/L (ref 17–63)
AST: 20 U/L (ref 15–41)
Albumin: 4.3 g/dL (ref 3.5–5.0)
Anion gap: 6 (ref 5–15)
BUN: 11 mg/dL (ref 6–20)
CALCIUM: 9.3 mg/dL (ref 8.9–10.3)
CHLORIDE: 108 mmol/L (ref 101–111)
CO2: 21 mmol/L — AB (ref 22–32)
Creatinine, Ser: 1.12 mg/dL (ref 0.61–1.24)
GFR calc non Af Amer: >60 mL/min (ref >60)
Glucose, Bld: 106 mg/dL — ABNORMAL HIGH (ref 65–99)
Potassium: 3.3 mmol/L — ABNORMAL LOW (ref 3.5–5.1)
SODIUM: 135 mmol/L (ref 135–145)
TOTAL PROTEIN: 8 g/dL (ref 6.5–8.1)
Total Bilirubin: 0.8 mg/dL (ref 0.3–1.2)

## 2015-08-23 LAB — CBC
HCT: 50.2 % (ref 39.0–52.0)
HEMOGLOBIN: 18.1 g/dL — AB (ref 13.0–17.0)
MCH: 31.8 pg (ref 26.0–34.0)
MCHC: 36.1 g/dL — AB (ref 30.0–36.0)
MCV: 88.2 fL (ref 78.0–100.0)
Platelets: 237 10*3/uL (ref 150–400)
RBC: 5.69 MIL/uL (ref 4.22–5.81)
RDW: 12.6 % (ref 11.5–15.5)
WBC: 10.1 10*3/uL (ref 4.0–10.5)

## 2015-08-23 LAB — SALICYLATE LEVEL

## 2015-08-23 LAB — ACETAMINOPHEN LEVEL: Acetaminophen (Tylenol), Serum: <10 ug/mL — ABNORMAL LOW (ref 10–30)

## 2015-08-23 LAB — ETHANOL

## 2015-08-23 MED ORDER — ACETAMINOPHEN 325 MG PO TABS: 650.0000 mg | ORAL_TABLET | ORAL | Status: DC | PRN

## 2015-08-23 MED ORDER — NICOTINE 21 MG/24HR TD PT24
21.0000 mg | MEDICATED_PATCH | Freq: Every day | TRANSDERMAL | Status: DC
  Filled 2015-08-23: qty 1

## 2015-08-23 MED ORDER — OXCARBAZEPINE 300 MG PO TABS
300.0000 mg | ORAL_TABLET | Freq: Two times a day (BID) | ORAL | Status: DC
  Administered 2015-08-23 – 2015-08-24 (×3): 300 mg via ORAL
  Filled 2015-08-23 (×3): qty 1

## 2015-08-23 MED ORDER — ONDANSETRON HCL 4 MG PO TABS: 4.0000 mg | ORAL_TABLET | Freq: Three times a day (TID) | ORAL | Status: DC | PRN

## 2015-08-23 MED ORDER — POTASSIUM CHLORIDE CRYS ER 10 MEQ PO TBCR
10.0000 meq | EXTENDED_RELEASE_TABLET | Freq: Two times a day (BID) | ORAL | Status: AC
  Administered 2015-08-23 – 2015-08-24 (×3): 10 meq via ORAL
  Filled 2015-08-23 (×3): qty 1

## 2015-08-23 MED ORDER — LORAZEPAM 1 MG PO TABS: 1.0000 mg | ORAL_TABLET | Freq: Three times a day (TID) | ORAL | Status: DC | PRN

## 2015-08-23 MED ORDER — HALOPERIDOL DECANOATE 100 MG/ML IM SOLN
100.0000 mg | INTRAMUSCULAR | Status: DC
  Administered 2015-08-23: 100 mg via INTRAMUSCULAR
  Filled 2015-08-23: qty 1

## 2015-08-23 MED ORDER — TRAZODONE HCL 50 MG PO TABS
125.0000 mg | ORAL_TABLET | Freq: Every day | ORAL | Status: DC
  Administered 2015-08-23: 125 mg via ORAL
  Filled 2015-08-23: qty 1

## 2015-08-23 MED ORDER — BENZTROPINE MESYLATE 1 MG PO TABS
0.5000 mg | ORAL_TABLET | Freq: Two times a day (BID) | ORAL | Status: DC
  Administered 2015-08-23 – 2015-08-24 (×3): 0.5 mg via ORAL
  Filled 2015-08-23 (×3): qty 1

## 2015-08-23 MED ORDER — HALOPERIDOL 5 MG PO TABS
5.0000 mg | ORAL_TABLET | Freq: Every day | ORAL | Status: DC
  Administered 2015-08-23 – 2015-08-24 (×2): 5 mg via ORAL
  Filled 2015-08-23 (×2): qty 1

## 2015-08-23 MED ORDER — HALOPERIDOL 5 MG PO TABS
10.0000 mg | ORAL_TABLET | Freq: Every evening | ORAL | Status: DC
  Administered 2015-08-23: 10 mg via ORAL
  Filled 2015-08-23: qty 2

## 2015-08-23 NOTE — ED Notes (Signed)
Patient noted sleeping in room. No complaints, stable, in no acute distress. Q15 minute rounds and monitoring via [employer] to continue.  

## 2015-08-23 NOTE — ED Notes (Signed)
Snack and beverage given. 

## 2015-08-23 NOTE — Consult Note (Addendum)
[facility] Consult   Reason for Consult:  Depression Referring Physician:  ED physician  Patient Identification: Mike [name] MRN:  [id number] Principal Diagnosis: Diagnosis:   Patient Active Problem List   Diagnosis Date Noted  . Intermittent explosive disorder [F63.81] 06/24/2015  . Schizoaffective disorder, bipolar type ([place]) [F25.0] 06/23/2015    Total Time spent with patient: 45 minutes  Subjective:   Mike [name] is a 35 y.o. male patient admitted with depression  HPI: Patient is a 35 year old man, presented to ED complaining of cough, malaise . ( Chest X Ray negative , no leukocytosis, no fever )  He also reported passive suicidal ideations, no plan or intent. He also reported intermittent auditory hallucinations, telling him to " punching walls".  He reported these hallucinations are chronic . At this time patient presents calm, fairly related, with limited eye contact, states he is feeling " a little better " but still depressed . At this time patient reports he has been depressed, but denies any suicidal plan /intention. States he has been " lying around , not doing much " recently, partly due to feeling ill recently. States " my little nephew was sick, and I think I caught what he had ". Patient has been prescribed Haldol /Trileptal. At this time compliance with medications is unclear as he has  reportedly  made  different statements to staff regarding whether he was taking his medications regularly . Patient denies any drug or alcohol abuse - BAL is <5, UDS is positive for cannabis  Patient lives with grandmother,lives with grandmother, and is on SSI. Has three children, who live with mother . He is originally from [place] .     Past Psychiatric History: Patient  has a history of prior psychiatric treatment , was hospitalized in June 2017 at [facility]. At the time was diagnosed with Schizoaffective Disorder, and was IVCd by family due to aggressive , explosive  behaviors, breaking items at home, and cutting self . Reports indicated he has a history of violence and aggression . He was stabilized on Haloperidol,  Cogentin , Trileptal,  and received Haldol Decanoate 100 mgrs IM x 1 on 07/28/15.   Risk to Self: Suicidal Ideation:  (Passive) Suicidal Intent: No Is patient at risk for suicide?: Yes Suicidal Plan?: No Access to Means: No What has been your use of drugs/alcohol within the last 12 months?: THC How many times?: 1 (8 years ago gesture - thought about hanging himself ) Other Self Harm Risks: Denies Triggers for Past Attempts: Other (Comment) (being in [place];) Intentional Self Injurious Behavior: None Risk to Others: Homicidal Ideation: No Thoughts of Harm to Others: No Current Homicidal Intent: No Current Homicidal Plan: No Access to Homicidal Means: No Identified Victim: Denies History of harm to others?: No Assessment of Violence: None Noted Violent Behavior Description: Denies Does patient have access to weapons?: No Criminal Charges Pending?: No Does patient have a court date: No Prior Inpatient Therapy: Prior Inpatient Therapy: Yes Prior Therapy Dates: 2017 Prior Therapy Facilty/Provider(s): [facility] Reason for Treatment: Schizophrenia (Haldol, Cogentin, Trazadone) Prior Outpatient Therapy: Prior Outpatient Therapy: No Prior Therapy Dates: N/A Prior Therapy Facilty/Provider(s): N/A Reason for Treatment: N/A Does patient have an ACCT team?: No Does patient have Intensive In-House Services?  : No Does patient have [facility] services? : No Does patient have P4CC services?: No  Past Medical History:  Past Medical History:  Diagnosis Date  . Schizophrenia ([place])    History reviewed. No pertinent surgical history.  Family History: No family history on file. Family Psychiatric  History: does not endorse  Social History:  History  Alcohol Use  . Yes     History  Drug Use  . Types: Cocaine, Marijuana    Social History   Social  History  . Marital status: Single    Spouse name: N/A  . Number of children: N/A  . Years of education: N/A   Social History Main Topics  . Smoking status: Never Smoker  . Smokeless tobacco: Never Used  . Alcohol use Yes  . Drug use:     Types: Cocaine, Marijuana  . Sexual activity: Not Asked   Other Topics Concern  . None   Social History Narrative  . None   Additional Social History:    Allergies:  No Known Allergies  Labs:  Results for orders placed or performed during the hospital encounter of 08/23/15 (from the past 48 hour(s))  Rapid urine drug screen (hospital performed)     Status: Abnormal   Collection Time: 08/23/15 12:40 AM  Result Value Ref Range   Opiates NONE DETECTED NONE DETECTED   Cocaine NONE DETECTED NONE DETECTED   Benzodiazepines NONE DETECTED NONE DETECTED   Amphetamines NONE DETECTED NONE DETECTED   Tetrahydrocannabinol POSITIVE (A) NONE DETECTED   Barbiturates NONE DETECTED NONE DETECTED    Comment:        DRUG SCREEN FOR MEDICAL PURPOSES ONLY.  IF CONFIRMATION IS NEEDED FOR ANY PURPOSE, NOTIFY LAB WITHIN 5 DAYS.        LOWEST DETECTABLE LIMITS FOR URINE DRUG SCREEN Drug Class       Cutoff (ng/mL) Amphetamine      1000 Barbiturate      200 Benzodiazepine   629 Tricyclics       476 Opiates          300 Cocaine          300 THC              50   Comprehensive metabolic panel     Status: Abnormal   Collection Time: 08/23/15 12:51 AM  Result Value Ref Range   Sodium 135 135 - 145 mmol/L   Potassium 3.3 (L) 3.5 - 5.1 mmol/L   Chloride 108 101 - 111 mmol/L   CO2 21 (L) 22 - 32 mmol/L   Glucose, Bld 106 (H) 65 - 99 mg/dL   BUN 11 6 - 20 mg/dL   Creatinine, Ser 1.12 0.61 - 1.24 mg/dL   Calcium 9.3 8.9 - 10.3 mg/dL   Total Protein 8.0 6.5 - 8.1 g/dL   Albumin 4.3 3.5 - 5.0 g/dL   AST 20 15 - 41 U/L   ALT 22 17 - 63 U/L   Alkaline Phosphatase 44 38 - 126 U/L   Total Bilirubin 0.8 0.3 - 1.2 mg/dL   GFR calc non Af Amer >60 >60 mL/min    GFR calc Af Amer >60 >60 mL/min    Comment: (NOTE) The eGFR has been calculated using the CKD EPI equation. This calculation has not been validated in all clinical situations. eGFR's persistently <60 mL/min signify possible Chronic Kidney Disease.    Anion gap 6 5 - 15  Ethanol     Status: None   Collection Time: 08/23/15 12:51 AM  Result Value Ref Range   Alcohol, Ethyl (B) <5 <5 mg/dL    Comment:        LOWEST DETECTABLE LIMIT FOR SERUM ALCOHOL IS 5 mg/dL FOR MEDICAL  PURPOSES ONLY   Salicylate level     Status: None   Collection Time: 08/23/15 12:51 AM  Result Value Ref Range   Salicylate Lvl <2.3 2.8 - 30.0 mg/dL  Acetaminophen level     Status: Abnormal   Collection Time: 08/23/15 12:51 AM  Result Value Ref Range   Acetaminophen (Tylenol), Serum <10 (L) 10 - 30 ug/mL    Comment:        THERAPEUTIC CONCENTRATIONS VARY SIGNIFICANTLY. A RANGE OF 10-30 ug/mL MAY BE AN EFFECTIVE CONCENTRATION FOR MANY PATIENTS. HOWEVER, SOME ARE BEST TREATED AT CONCENTRATIONS OUTSIDE THIS RANGE. ACETAMINOPHEN CONCENTRATIONS >150 ug/mL AT 4 HOURS AFTER INGESTION AND >50 ug/mL AT 12 HOURS AFTER INGESTION ARE OFTEN ASSOCIATED WITH TOXIC REACTIONS.   cbc     Status: Abnormal   Collection Time: 08/23/15 12:51 AM  Result Value Ref Range   WBC 10.1 4.0 - 10.5 K/uL   RBC 5.69 4.22 - 5.81 MIL/uL   Hemoglobin 18.1 (H) 13.0 - 17.0 g/dL   HCT 50.2 39.0 - 52.0 %   MCV 88.2 78.0 - 100.0 fL   MCH 31.8 26.0 - 34.0 pg   MCHC 36.1 (H) 30.0 - 36.0 g/dL   RDW 12.6 11.5 - 15.5 %   Platelets 237 150 - 400 K/uL    Current Facility-Administered Medications  Medication Dose Route Frequency Provider Last Rate Last Dose  . acetaminophen (TYLENOL) tablet 650 mg  650 mg Oral Q4H PRN Charlann Lange, PA-C      . LORazepam (ATIVAN) tablet 1 mg  1 mg Oral Q8H PRN Shari Upstill, PA-C      . nicotine (NICODERM CQ - dosed in mg/24 hours) patch 21 mg  21 mg Transdermal Daily Shari Upstill, PA-C      .  ondansetron (ZOFRAN) tablet 4 mg  4 mg Oral Q8H PRN Charlann Lange, PA-C       Current Outpatient Prescriptions  Medication Sig Dispense Refill  . benztropine (COGENTIN) 0.5 MG tablet Take 1 tablet (0.94m) in the morning & at bedtime: For prevention of drug induced tremors 60 tablet 0  . haloperidol (HALDOL) 1 MG tablet Take 7 tablets (7 mg total) by mouth daily. For mood control (Patient taking differently: Take 5 mg by mouth daily. For mood control) 30 tablet 0  . haloperidol (HALDOL) 10 MG tablet Take 1 tablet (10 mg total) by mouth every evening. For mood control 30 tablet 0  . traZODone (DESYREL) 150 MG tablet Take 1 tablet (150 mg total) by mouth at bedtime. For sleep 30 tablet 0  . haloperidol decanoate (HALDOL DECANOATE) 100 MG/ML injection Inject 1 mL (100 mg total) into the muscle every 28 (twenty-eight) days. (Due to be administered on 07-28-15): For mood control (Patient not taking: Reported on 08/23/2015) 1 mL 0  . nicotine (NICODERM CQ - DOSED IN MG/24 HOURS) 21 mg/24hr patch Place 1 patch (21 mg total) onto the skin daily. For smoking cessation (Patient not taking: Reported on 08/23/2015) 28 patch 0  . Oxcarbazepine (TRILEPTAL) 300 MG tablet Take 1 tablet (300 mg) in the morning & 2 tablets (600 mg) at bedtime: for mood stabilization (Patient not taking: Reported on 08/23/2015) 90 tablet 0    Musculoskeletal: Strength & Muscle Tone: within normal limits Gait & Station: not assessed, patient in bed  Patient leans: N/A  Psychiatric Specialty Exam: Physical Exam  ROS  No headache, describes cough x 1-2 weeks, productive, denies shortness of breath, no fever or chills, no rash   Blood  pressure 111/68, pulse (!) 56, temperature 97.8 F (36.6 C), temperature source Oral, resp. rate 18, SpO2 96 %.There is no height or weight on file to calculate BMI.  General Appearance: Fairly Groomed  Eye Contact:  Fair  Speech:  Normal Rate  Volume:  Decreased  Mood:  reports depression   Affect:   presents vaguely irritable   Thought Process:  Linear  Orientation:  Other:  fully alert and attentive   Thought Content:  Reports recent auditory hallucinations, but at this time denies and does not appear internally preoccupied - no delusions expressed   Suicidal Thoughts:  currently denies plan or intention of hurting self or of suicide, had reported passive SI on admission to ED   Homicidal Thoughts:  No- currently denies homicidal ideations   Memory:  recent and remote grossly intact   Judgement:  Fair  Insight:  Fair  Psychomotor Activity:  Decreased  Concentration:  Concentration: Good and Attention Span: Good  Recall:  Good  Fund of Knowledge:  Good  Language:  Good  Akathisia:  Negative  Handed:  Right  AIMS (if indicated):     Assets:  Desire for Improvement Resilience  ADL's:  Intact  Cognition:  WNL  Sleep:        Treatment Plan Summary: Daily contact with patient to assess and evaluate symptoms and progress in treatment and Medication management  Disposition: No evidence of imminent risk to self or others at present.   Will monitor in ED and reassess regularly. He is being managed with Haldol, Trileptal, which as noted he did well on during a recent admission . Will discuss/encourage Haldol Decanoate injection. Monitor EKG to rule out QT c prolongation and correct hypokalemia .     Neita Garnet, MD 08/23/2015 10:46 AM

## 2015-08-23 NOTE — ED Notes (Signed)
EKG-12 LEAD COMPLETED: MD and NP reviewed results.

## 2015-08-23 NOTE — ED Triage Notes (Addendum)
Pt transported from home by EMS with c/o productive cough, general malaise x 4 days. Pt states he feels like he is now entering a manic phase, +A/VH with commands, +SI and HI.

## 2015-08-23 NOTE — ED Notes (Signed)
Pt. Transferred to [facility] from ED to room 36. Report to include Situation, Background, Assessment and Recommendations from Autumn RN. Pt. Oriented to unit including Q15 minute rounds as well as the security cameras for their protection. Patient is alert and oriented, warm and dry in no acute distress. Patient denies HI, and AVH. Pt. States he has SI without plan. Pt. Encouraged to let me know if needs arise.

## 2015-08-23 NOTE — ED Notes (Signed)
Report to include situation, background, assessment and recommendations from Janie Rambo RN. Patient sleeping, respirations regular and unlabored. Q15 minute rounds and security camera observation to continue.   

## 2015-08-23 NOTE — ED Provider Notes (Signed)
[facility]-EMERGENCY DEPT Provider Note   CSN: [id number] Arrival date & time: 08/23/15  4098  First Provider Contact:  First MD Initiated Contact with Patient 08/23/15 0107     By signing my name below, I, Mike [name], attest that this documentation has been prepared under the direction and in the presence of Elpidio Anis, PA-C. Electronically Signed: Linna [name], Scribe. 08/23/2015. 1:07 AM.   History   Chief Complaint Chief Complaint  Patient presents with  . Suicidal    The history is provided by the patient. No language interpreter was used.     HPI Comments: Mike [name] is a 35 y.o. male with PMHx of schizophrenia who presents to the Emergency Department complaining of suicidal ideation and depression for the last week. Pt denies any self-harm. Pt reports he has never been to the ER due to suicidal thoughts or depression in the past. He reports he has a lot of life stressors currently. Pt endorses both auditory and visual hallucinations for the last week; he notes the voices he hears are not commanding him to do anything. He states he has experienced hallucinations in the past. Pt reports that he is supposed to be on psychiatric medications but is taking them irregularly. He is a smoker. He denies HI or any other complaints.   Past Medical History:  Diagnosis Date  . Schizophrenia [facility])     Patient Active Problem List   Diagnosis Date Noted  . Intermittent explosive disorder 06/24/2015  . Schizoaffective disorder, bipolar type ([facility]) 06/23/2015    History reviewed. No pertinent surgical history.     Home Medications    Prior to Admission medications   Medication Sig Start Date End Date Taking? Authorizing Provider  benztropine (COGENTIN) 0.5 MG tablet Take 1 tablet (0.5mg ) in the morning & at bedtime: For prevention of drug induced tremors 07/01/15  Yes Sanjuana Kava, NP  haloperidol (HALDOL) 1 MG tablet Take 7 tablets (7 mg total) by mouth daily. For mood  control Patient taking differently: Take 5 mg by mouth daily. For mood control 07/01/15  Yes Sanjuana Kava, NP  haloperidol (HALDOL) 10 MG tablet Take 1 tablet (10 mg total) by mouth every evening. For mood control 07/01/15  Yes Sanjuana Kava, NP  traZODone (DESYREL) 150 MG tablet Take 1 tablet (150 mg total) by mouth at bedtime. For sleep 07/01/15  Yes Sanjuana Kava, NP  haloperidol decanoate (HALDOL DECANOATE) 100 MG/ML injection Inject 1 mL (100 mg total) into the muscle every 28 (twenty-eight) days. (Due to be administered on 07-28-15): For mood control Patient not taking: Reported on 08/23/2015 07/28/15   Sanjuana Kava, NP  nicotine (NICODERM CQ - DOSED IN MG/24 HOURS) 21 mg/24hr patch Place 1 patch (21 mg total) onto the skin daily. For smoking cessation Patient not taking: Reported on 08/23/2015 07/01/15   Sanjuana Kava, NP  Oxcarbazepine (TRILEPTAL) 300 MG tablet Take 1 tablet (300 mg) in the morning & 2 tablets (600 mg) at bedtime: for mood stabilization Patient not taking: Reported on 08/23/2015 07/01/15   Sanjuana Kava, NP    Family History No family history on file.  Social History Social History  Substance Use Topics  . Smoking status: Never Smoker  . Smokeless tobacco: Never Used  . Alcohol use Yes     Allergies   Review of patient's allergies indicates no known allergies.   Review of Systems Review of Systems  Constitutional: Negative for chills and fever.  Respiratory: Negative.  Cardiovascular: Negative.   Gastrointestinal: Negative.   Musculoskeletal: Negative.   Skin: Negative.   Neurological: Negative.   Psychiatric/Behavioral: Positive for dysphoric mood, hallucinations and suicidal ideas. Negative for self-injury.       Negative for homicidal ideation.     Physical Exam Updated Vital Signs BP 113/84   Pulse 79   Temp 98.5 F (36.9 C) (Oral)   Resp 16   SpO2 97%   Physical Exam  Constitutional: He is oriented to person, place, and time. He appears  well-developed and well-nourished. No distress.  HENT:  Head: Normocephalic and atraumatic.  Eyes: Conjunctivae and EOM are normal.  Neck: Neck supple. No tracheal deviation present.  Cardiovascular: Normal rate.   Pulmonary/Chest: Effort normal. No respiratory distress.  Musculoskeletal: Normal range of motion.  Neurological: He is alert and oriented to person, place, and time.  Skin: Skin is warm and dry.  Psychiatric: He has a normal mood and affect. His behavior is normal.  Nursing note and vitals reviewed.    ED Treatments / Results  Labs (all labs ordered are listed, but only abnormal results are displayed) Labs Reviewed  COMPREHENSIVE METABOLIC PANEL  ETHANOL  SALICYLATE LEVEL  ACETAMINOPHEN LEVEL  CBC  URINE RAPID DRUG SCREEN, HOSP PERFORMED    EKG  EKG Interpretation None       Radiology No results found.  Procedures Procedures (including critical care time)  DIAGNOSTIC STUDIES: Oxygen Saturation is 97% on RA, normal by my interpretation.    COORDINATION OF CARE: 1:10 AM Discussed treatment plan with pt at bedside and pt agreed to plan.   Medications Ordered in ED Medications - No data to display   Initial Impression / Assessment and Plan / ED Course  I have reviewed the triage vital signs and the nursing notes.  Pertinent labs & imaging results that were available during my care of the patient were reviewed by me and considered in my medical decision making (see chart for details).  Clinical Course    Patient with psychiatric history presents with suicidal ideations, hallucinations. Seen by TTS with recommendation to stay for am psychiatric evaluation.    I personally performed the services described in this documentation, which was scribed in my presence. The recorded information has been reviewed and is accurate.    Final Clinical Impressions(s) / ED Diagnoses   Final diagnoses:  None   1. Suicidal ideation 2. Hallucinations  New  Prescriptions New Prescriptions   No medications on file     Elpidio AnisShari Marcedes Tech, Cordelia Poche-C 09/06/15 0011    Mancel BaleElliott Wentz, MD 09/09/15 [phone]

## 2015-08-23 NOTE — ED Notes (Signed)
Up to the bathroom 

## 2015-08-23 NOTE — ED Notes (Signed)
Dr Cobbos and Jamison DNP into see 

## 2015-08-23 NOTE — BH Assessment (Addendum)
Assessment completed. Consulted with Donell SievertSpencer Simon, PA-C who recommends inpatient treatment at this time. Recommends inpatient treatment for an acute bed (500).   Davina PokeJoVea Waylon Koffler, LCSW Therapeutic Triage Specialist [facility] 08/23/2015 2:03 AM

## 2015-08-23 NOTE — ED Notes (Signed)
Patient denies pain and is resting comfortably.  

## 2015-08-23 NOTE — ED Notes (Signed)
Patient A/O, no noted distress. Denies SI. Patient notes he AH, voices "calling my name and telling me to hit things, just hit things." Denies hearing voices at time of assessment. Denies taking any medications. [place], but has a family support here. Staff will continue to monitor, meet needs, and maintain safety.

## 2015-08-23 NOTE — BH Assessment (Addendum)
Assessment Note  Mike [name] is an 35 y.o. male presenting to [facility] voluntarily for passive suicidal ideations with no plan or intent and an increase in auditory hallucinations. When asked If he was suicidal patient states "a little bit" and declined to elaborate when asked. Patient denies self injurious behaviors. Patient states that he has had suicidal thoughts in the past six months stating "I just thought about dying or finding something to cut myself with." Patient denies previous attempts but states that he thought about committing suicide in 2009 when he was in jail reporting "I was just trying to find something to hang myself with." Patient denies HI and history of aggression. Patient denies pending charges and upcoming court dates. Patient denies access to firearms. Patient states that he hs auditory hallucinations stating that voices "tell me to punch holes in the wall and stuff like that." Patient states that those voices have increased which is why he came in tonight. Patient states that he has heard these voices since his "early twenties." Patient states that he also hears people calling his name. Patient states that he occasionally "sees spots" and denies other hallucinations. Patient did not appear to be responding to internal stimuli during the assessment.    Patient is alert and oriented x4. Patient states that he lives with his grandmother and reports that has been his main stressor lately. Patient states that they "go back and forth arguing." Patient states that he feels depressed and endorses symptoms of depression as "sometimes" isolating himself from others, feeling hopeless, and feeling more irritable than usual. Patient states that his grandmother is supportive. Patient denies history of trauma/abuse. Patient states that he smokes "two blunts" of THC "every other day" and states he last smoked yesterday. Patient denies use of other drugs and alcohol. Patients BAL <5 and UDS + THC.   Patients states that he cannot contract for safety at this time.   Consulted with Mike SievertSpencer Simon, PA-C who recommends inpatient treatment at this time.   Diagnosis: Schizoaffective Disorder, Bipolar Type   Past Medical History:  Past Medical History:  Diagnosis Date  . Schizophrenia (HCC)     History reviewed. No pertinent surgical history.  Family History: No family history on file.  Social History:  reports that he has never smoked. He has never used smokeless tobacco. He reports that he drinks alcohol. He reports that he uses drugs, including Cocaine and Marijuana.  Additional Social History:  Alcohol / Drug Use Pain Medications: Denies Prescriptions: Denies Over the Counter: Denies History of alcohol / drug use?: Yes Longest period of sobriety (when/how long): "weeks" Substance #1 Name of Substance 1: THC 1 - Age of First Use: 20 1 - Amount (size/oz): 2 blunts 1 - Frequency: every other day 1 - Last Use / Amount: yesterda  CIWA: CIWA-Ar BP: 113/84 Pulse Rate: 79 COWS:    Allergies: No Known Allergies  Home Medications:  (Not in a hospital admission)  OB/GYN Status:  No LMP for male patient.  General Assessment Data Location of Assessment: [facility] TTS Assessment: In system Is this a Tele or Face-to-Face Assessment?: Face-to-Face Is this an Initial Assessment or a Re-assessment for this encounter?: Initial Assessment Marital status: Single Is patient pregnant?: No Pregnancy Status: No Living Arrangements: Other relatives Can pt return to current living arrangement?: Yes Admission Status: Voluntary Is patient capable of signing voluntary admission?: Yes Referral Source: Self/Family/Friend     Crisis Care Plan Living Arrangements: Other relatives Name of Psychiatrist: None Name  of Therapist: None  Education Status Is patient currently in school?: No Highest grade of school patient has completed: GED  Risk to self with the past 6 months Suicidal  Ideation:  (Passive) Has patient been a risk to self within the past 6 months prior to admission? : Yes Suicidal Intent: No Has patient had any suicidal intent within the past 6 months prior to admission? : Yes Is patient at risk for suicide?: Yes Suicidal Plan?: No Has patient had any suicidal plan within the past 6 months prior to admission? : Yes Access to Means: No What has been your use of drugs/alcohol within the last 12 months?: THC Previous Attempts/Gestures: Yes How many times?: 1 (8 years ago gesture - thought about hanging himself ) Other Self Harm Risks: Denies Triggers for Past Attempts: Other (Comment) [place] Intentional Self Injurious Behavior: None Family Suicide History: No Recent stressful life event(s): Conflict (Comment) (with grandmother) Persecutory voices/beliefs?: Yes Depression: Yes Depression Symptoms: Isolating, Feeling worthless/self pity, Feeling angry/irritable ("sometimes") Substance abuse history and/or treatment for substance abuse?: Yes Suicide prevention information given to non-admitted patients: Not applicable  Risk to Others within the past 6 months Homicidal Ideation: No Does patient have any lifetime risk of violence toward others beyond the six months prior to admission? : No Thoughts of Harm to Others: No Current Homicidal Intent: No Current Homicidal Plan: No Access to Homicidal Means: No Identified Victim: Denies History of harm to others?: No Assessment of Violence: None Noted Violent Behavior Description: Denies Does patient have access to weapons?: No Criminal Charges Pending?: No Does patient have a court date: No Is patient on probation?: No  Psychosis Hallucinations: Auditory (yelling my name out - see spots) Delusions: None noted  Mental Status Report Appearance/Hygiene: In scrubs Eye Contact: Good Motor Activity: Unable to assess Speech: Logical/coherent Level of Consciousness: Alert Mood: Pleasant Affect:  Appropriate to circumstance Anxiety Level: None Thought Processes: Coherent, Relevant Judgement: Unimpaired Orientation: Person, Place, Time, Situation, Appropriate for developmental age Obsessive Compulsive Thoughts/Behaviors: None  Cognitive Functioning Concentration: Good Memory: Recent Intact, Remote Intact IQ: Average Insight: Fair Appetite: Fair Weight Loss: 0 Weight Gain: 0 Sleep: No Change Total Hours of Sleep: 6 Vegetative Symptoms: None  ADLScreening [facility] Assessment Services) Patient's cognitive ability adequate to safely complete daily activities?: Yes Patient able to express need for assistance with ADLs?: Yes Independently performs ADLs?: Yes (appropriate for developmental age)  Prior Inpatient Therapy Prior Inpatient Therapy: Yes Prior Therapy Dates: 2017 Prior Therapy Facilty/Provider(s): [facility] Reason for Treatment: Schizophrenia (Haldol, Cogentin, Trazadone)  Prior Outpatient Therapy Prior Outpatient Therapy: No Prior Therapy Dates: N/A Prior Therapy Facilty/Provider(s): N/A Reason for Treatment: N/A Does patient have an ACCT team?: No Does patient have Intensive In-House Services?  : No Does patient have [facility] services? : No Does patient have P4CC services?: No  ADL Screening (condition at time of admission) Patient's cognitive ability adequate to safely complete daily activities?: Yes Is the patient deaf or have difficulty hearing?: No Does the patient have difficulty seeing, even when wearing glasses/contacts?: No Does the patient have difficulty concentrating, remembering, or making decisions?: No Patient able to express need for assistance with ADLs?: Yes Does the patient have difficulty dressing or bathing?: No Independently performs ADLs?: Yes (appropriate for developmental age) Does the patient have difficulty walking or climbing stairs?: No Weakness of Legs: None Weakness of Arms/Hands: None  Home Assistive Devices/Equipment Home Assistive  Devices/Equipment: None    Abuse/Neglect Assessment (Assessment to be complete while patient  is alone) Physical Abuse: Denies Verbal Abuse: Denies Sexual Abuse: Denies Exploitation of patient/patient's resources: Denies Self-Neglect: Denies Values / Beliefs Cultural Requests During Hospitalization: None Spiritual Requests During Hospitalization: None   Advance Directives (For Healthcare) Does patient have an advance directive?: No Would patient like information on creating an advanced directive?: No - patient declined information    Additional Information 1:1 In Past 12 Months?: No CIRT Risk: No Elopement Risk: No Does patient have medical clearance?: No     Disposition:  Disposition Initial Assessment Completed for this Encounter: Yes Disposition of Patient: Inpatient treatment program (per Mike Sievert, PA-C) Type of inpatient treatment program: Adult  On Site Evaluation by:   Reviewed with Physician:    Giara Mcgaughey 08/23/2015 2:09 AM

## 2015-08-23 NOTE — ED Notes (Signed)
Patient slept most of day. Tolerated all meds. Patient slept some time in dayroom. No bizarre behavior. Grandmother called noted she was afraid to go home, if he was discharge from hospital. She noted she did not stay home last night. Requested he be admitted in a facility for two months. Educated patient on medication. Staff will continue to monitor, meet needs, and maintain safety.

## 2015-08-24 ENCOUNTER — Emergency Department (EMERGENCY_DEPARTMENT_HOSPITAL)
Admission: EM | Admit: 2015-08-24 | Discharge: 2015-08-25 | Disposition: A | Discharge status: Home / Self Care | Payer: Medicaid Other | Source: Home / Self Care | Attending: Emergency Medicine | Admitting: Emergency Medicine

## 2015-08-24 ENCOUNTER — Encounter (HOSPITAL_COMMUNITY): Payer: Self-pay | Admitting: Emergency Medicine

## 2015-08-24 DIAGNOSIS — F17210 Nicotine dependence, cigarettes, uncomplicated: Secondary | ICD-10-CM | POA: Insufficient documentation

## 2015-08-24 DIAGNOSIS — F250 Schizoaffective disorder, bipolar type: Secondary | ICD-10-CM

## 2015-08-24 DIAGNOSIS — R45850 Homicidal ideations: Secondary | ICD-10-CM | POA: Insufficient documentation

## 2015-08-24 DIAGNOSIS — R45851 Suicidal ideations: Secondary | ICD-10-CM

## 2015-08-24 DIAGNOSIS — Z79899 Other long term (current) drug therapy: Secondary | ICD-10-CM | POA: Insufficient documentation

## 2015-08-24 DIAGNOSIS — F6381 Intermittent explosive disorder: Secondary | ICD-10-CM

## 2015-08-24 MED ORDER — TRAZODONE HCL 150 MG PO TABS: 150.0000 mg | ORAL_TABLET | Freq: Every day | ORAL | 0 refills | Status: AC

## 2015-08-24 MED ORDER — ACETAMINOPHEN 325 MG PO TABS
650.0000 mg | ORAL_TABLET | ORAL | Status: DC | PRN
  Administered 2015-08-24: 650 mg via ORAL
  Filled 2015-08-24: qty 2

## 2015-08-24 MED ORDER — ACETAMINOPHEN 500 MG PO TABS: 1000.0000 mg | ORAL_TABLET | Freq: Once | ORAL | Status: DC

## 2015-08-24 MED ORDER — TRAZODONE HCL 50 MG PO TABS: 150.0000 mg | ORAL_TABLET | Freq: Every day | ORAL | Status: DC

## 2015-08-24 MED ORDER — BENZTROPINE MESYLATE 1 MG PO TABS
0.5000 mg | ORAL_TABLET | Freq: Two times a day (BID) | ORAL | Status: DC
  Administered 2015-08-25: 0.5 mg via ORAL
  Filled 2015-08-24: qty 1

## 2015-08-24 MED ORDER — BENZTROPINE MESYLATE 1 MG PO TABS: 0.5000 mg | ORAL_TABLET | Freq: Every day | ORAL | Status: DC

## 2015-08-24 MED ORDER — LORAZEPAM 1 MG PO TABS: 1.0000 mg | ORAL_TABLET | Freq: Three times a day (TID) | ORAL | Status: DC | PRN

## 2015-08-24 MED ORDER — BENZTROPINE MESYLATE 0.5 MG PO TABS: ORAL_TABLET | ORAL | 0 refills | Status: AC

## 2015-08-24 MED ORDER — HALOPERIDOL 5 MG PO TABS: 10.0000 mg | ORAL_TABLET | Freq: Every evening | ORAL | Status: DC

## 2015-08-24 MED ORDER — HALOPERIDOL 10 MG PO TABS: 10.0000 mg | ORAL_TABLET | Freq: Every evening | ORAL | 0 refills | Status: AC

## 2015-08-24 MED ORDER — HALOPERIDOL 5 MG PO TABS: 5.0000 mg | ORAL_TABLET | Freq: Every day | ORAL | 0 refills | Status: AC

## 2015-08-24 MED ORDER — HALOPERIDOL 5 MG PO TABS
5.0000 mg | ORAL_TABLET | Freq: Every day | ORAL | Status: DC
  Administered 2015-08-25: 5 mg via ORAL
  Filled 2015-08-24: qty 1

## 2015-08-24 MED ORDER — OXCARBAZEPINE 300 MG PO TABS: ORAL_TABLET | ORAL | 0 refills | Status: AC

## 2015-08-24 NOTE — ED Notes (Signed)
Pt requesting some tylenol. This nurse stated that the doctor would be in to see him soon.

## 2015-08-24 NOTE — ED Notes (Signed)
TTS into see 

## 2015-08-24 NOTE — ED Notes (Signed)
CSW to contact pt's grandmother reguarding his dc

## 2015-08-24 NOTE — ED Notes (Signed)
Per Dr. Clarene DukeLittle, not neccessary to redraw labs at this time d/t pt was [facility]

## 2015-08-24 NOTE — ED Notes (Signed)
Patient noted sleeping in room. No complaints, stable, in no acute distress. Q15 minute rounds and monitoring via [employer] to continue.  

## 2015-08-24 NOTE — ED Notes (Signed)
SBAR Report received from previous nurse. Pt received calm and visible on unit. Pt denies current SI/ HI, A/V H, depression, anxiety, and pain at this time, and is otherwise stable. Pt reminded of camera surveillance, q 15 min rounds, and rules of the milieu. Will continue to assess. 

## 2015-08-24 NOTE — ED Triage Notes (Signed)
Patient states he is suicidal, and that he attempted to "jump off a bridge, off the side of the road". Patient was discharged earlier today for same complaint, states he was "having a little" of the same thoughts then. Patient brought in by [employer], voluntary.

## 2015-08-24 NOTE — BH Assessment (Signed)
Patient does not currently meet inpatient criteria for inpatient psychiatric treatment per Dr. Jama Flavorsobos . [facility]. A list of additional providers have been added to patients discharge instructions as well.    Davina PokeJoVea Tao Satz, LCSW Therapeutic Triage Specialist [facility] 08/24/2015 10:31 AM

## 2015-08-24 NOTE — ED Notes (Signed)
Dr Jama Flavorsobos into see

## 2015-08-24 NOTE — BHH Suicide Risk Assessment (Addendum)
Suicide Risk Assessment  Discharge Assessment   [facility] Discharge Suicide Risk Assessment   Principal Problem: Schizoaffective disorder, bipolar type [facility]) Discharge Diagnoses:  Patient Active Problem List   Diagnosis Date Noted  . Intermittent explosive disorder [F63.81] 06/24/2015    Priority: High  . Schizoaffective disorder, bipolar type (HCC) [F25.0] 06/23/2015    Priority: High    Total Time spent with patient: 30 minutes  Musculoskeletal: Strength & Muscle Tone: within normal limits Gait & Station: not assessed, patient in bed  Patient leans: N/A  Psychiatric Specialty Exam: Physical Exam  Constitutional: He is oriented to person, place, and time. He appears well-developed and well-nourished.  HENT:  Head: Normocephalic.  Neck: Normal range of motion.  Respiratory: Effort normal.  Musculoskeletal: Normal range of motion.  Neurological: He is alert and oriented to person, place, and time.  Skin: Skin is warm and dry.  Psychiatric: He has a normal mood and affect. His speech is normal and behavior is normal. Judgment normal. Cognition and memory are normal.    Review of Systems  Constitutional: Negative.   HENT: Negative.   Eyes: Negative.   Respiratory: Negative.   Cardiovascular: Negative.   Gastrointestinal: Negative.   Genitourinary: Negative.   Musculoskeletal: Negative.   Skin: Negative.   Neurological: Negative.   Endo/Heme/Allergies: Negative.   Psychiatric/Behavioral: Negative.     No headache, describes cough x 1-2 weeks, productive, denies shortness of breath, no fever or chills, no rash   Blood pressure 120/75, pulse 86, temperature 98.4 F (36.9 C), temperature source Oral, resp. rate 18, SpO2 96 %.There is no height or weight on file to calculate BMI.  General Appearance: Fairly Groomed  Eye Contact:  Good  Speech:  Normal Rate  Volume:  Normal  Mood:  Euthymic  Affect: Congruent  Thought Process: WDL  Orientation:  Alert and oriented x 3    Thought Content:  WDL  Suicidal Thoughts:  No  Homicidal Thoughts:  No-   Memory:  Recent, good; remote, good; immediate, good  Judgement:  Fair  Insight:  Fair  Psychomotor Activity:  WDL  Concentration:  Concentration: Good and Attention Span: Good  Recall:  Good  Fund of Knowledge:  Good  Language:  Good  Akathisia:  Negative  Handed:  Right  AIMS (if indicated):     Assets:  Desire for Improvement Resilience housing, social support  ADL's:  Intact  Cognition:  WNL  Sleep:       Mental Status Per Nursing Assessment::   On Admission:   Suicidal ideations  Demographic Factors:  Male  Loss Factors: NA  Historical Factors: NA  Risk Reduction Factors:   Living with another person, especially a relative, Positive social support and Positive therapeutic relationship  Continued Clinical Symptoms:  None  Cognitive Features That Contribute To Risk:  None    Suicide Risk:  Minimal: No identifiable suicidal ideation.  Patients presenting with no risk factors but with morbid ruminations; may be classified as minimal risk based on the severity of the depressive symptoms    Plan Of Care/Follow-up recommendations:  Activity:  as tolerated Diet:  heart healthy diet  LORD, JAMISON, NP 08/24/2015, 9:23 AM  As reviewed with staff, patient has remained calm on unit, no agitated or self injurious behaviors, eating well, watching TV .  At this time patient presents calm, cooperative, in good behavioral control. No coughing , no shortness of breath, no fever or chills at this time  He denies medication side effects and  does not present with akathisia or abnormal involuntary movements . He reports improved mood, states he is feeling "OK" today. Denies any suicidal or self injurious ideations, denies any  homicidal or violent ideations .  Specifically also denies any thoughts of violence towards grandmother or family members. Denies hallucinations, no delusions expressed . He is  future oriented, states that at this time his plan is to [place] to [place] in the near future .  [facility], and states he plans to stay in a shelter, as it appears that grandmother will not allow him to return at this time. Agrees to return to ED if needed .    Sallyanne HaversF Deuce Paternoster, MD

## 2015-08-24 NOTE — ED Provider Notes (Signed)
[facility] DEPT Provider Note   CSN: [id number] Arrival date & time: 08/24/15  1716  First Provider Contact:  None       History   Chief Complaint Chief Complaint  Patient presents with  . Suicidal    HPI Mike [name] is a 35 y.o. male.  35yo M w/ PMH including schizoaffective disorder bipolar type, intermittent explosive disorder who p/w suicidal ideation and homicidal ideation. Pt was evaluated in ED last night/ this morning for SI and was discharged after psychiatry evaluation. Since then, he went back to his "old stomping ground" where he spoke with family members and became upset. He feels like he is "about to snap" and kill people. No specific plans of who he wants to hurt. He also endorses SI with no specific plan. He endorses auditory and visual hallucinations, stating he is seeing demons and they are taunting him, telling him he has "nothing to be proud of."    Of note, pt's grandmother called ED and notified that he is banned from her household. Family is concerned that he will hurt them.   The history is provided by the patient.    Past Medical History:  Diagnosis Date  . Schizophrenia [facility](HCC)     Patient Active Problem List   Diagnosis Date Noted  . Intermittent explosive disorder 06/24/2015  . Schizoaffective disorder, bipolar type (HCC) 06/23/2015    History reviewed. No pertinent surgical history.     Home Medications    Prior to Admission medications   Medication Sig Start Date End Date Taking? Authorizing Provider  benztropine (COGENTIN) 0.5 MG tablet Take 1 tablet (0.5mg ) in the morning & at bedtime: For prevention of drug induced tremors 08/24/15  Yes Charm RingsJamison Y Lord, NP  haloperidol (HALDOL) 1 MG tablet Take 7 tablets (7 mg total) by mouth daily. For mood control Patient taking differently: Take 5 mg by mouth daily. For mood control 07/01/15  Yes Sanjuana KavaAgnes I Nwoko, NP  Oxcarbazepine (TRILEPTAL) 300 MG tablet Take 1 tablet (300 mg) in the morning &  2 tablets (600 mg) at bedtime: for mood stabilization 08/24/15  Yes Charm RingsJamison Y Lord, NP  haloperidol (HALDOL) 10 MG tablet Take 1 tablet (10 mg total) by mouth every evening. For mood control Patient not taking: Reported on 08/24/2015 08/24/15 09/07/15  Charm RingsJamison Y Lord, NP  haloperidol (HALDOL) 5 MG tablet Take 1 tablet (5 mg total) by mouth daily. Patient not taking: Reported on 08/24/2015 08/24/15 09/07/15  Charm RingsJamison Y Lord, NP  haloperidol decanoate (HALDOL DECANOATE) 100 MG/ML injection Inject 1 mL (100 mg total) into the muscle every 28 (twenty-eight) days. (Due to be administered on 07-28-15): For mood control Patient not taking: Reported on 08/23/2015 07/28/15   Sanjuana KavaAgnes I Nwoko, NP  nicotine (NICODERM CQ - DOSED IN MG/24 HOURS) 21 mg/24hr patch Place 1 patch (21 mg total) onto the skin daily. For smoking cessation Patient not taking: Reported on 08/23/2015 07/01/15   Sanjuana KavaAgnes I Nwoko, NP  traZODone (DESYREL) 150 MG tablet Take 1 tablet (150 mg total) by mouth at bedtime. For sleep Patient not taking: Reported on 08/24/2015 08/24/15   Charm RingsJamison Y Lord, NP    Family History History reviewed. No pertinent family history.  Social History Social History  Substance Use Topics  . Smoking status: Current Every Day Smoker    Packs/day: 1.50    Types: Cigarettes  . Smokeless tobacco: Never Used  . Alcohol use Yes     Comment: social, "a cup of beer today" 08/24/2015  Allergies   Review of patient's allergies indicates no known allergies.   Review of Systems Review of Systems 10 Systems reviewed and are negative for acute change except as noted in the HPI.   Physical Exam Updated Vital Signs BP 132/98 (BP Location: Left Arm)   Pulse 109   Temp 98.1 F (36.7 C) (Oral)   Resp 18   SpO2 95%   Physical Exam  Constitutional: He is oriented to person, place, and time. He appears well-developed and well-nourished. No distress.  HENT:  Head: Normocephalic and atraumatic.  Moist mucous membranes    Eyes: Pupils are equal, round, and reactive to light.  Bilateral conjunctival injection  Neck: Neck supple.  Cardiovascular: Normal rate, regular rhythm and normal heart sounds.   No murmur heard. Pulmonary/Chest: Effort normal and breath sounds normal.  Abdominal: Soft. Bowel sounds are normal. He exhibits no distension. There is no tenderness.  Musculoskeletal: He exhibits no edema.  Neurological: He is alert and oriented to person, place, and time.  Fluent speech  Skin: Skin is warm and dry.  Psychiatric:  Calm, cooperative during interview, fluent speech  Nursing note and vitals reviewed.    ED Treatments / Results  Labs (all labs ordered are listed, but only abnormal results are displayed) Labs Reviewed - No data to display  EKG  EKG Interpretation None       Radiology Dg Chest 2 View  Result Date: 08/23/2015 CLINICAL DATA:  Acute onset of productive cough and generalized malaise. Initial encounter. EXAM: CHEST  2 VIEW COMPARISON:  None. FINDINGS: The lungs are well-aerated and clear. There is no evidence of focal opacification, pleural effusion or pneumothorax. The heart is normal in size; the mediastinal contour is within normal limits. No acute osseous abnormalities are seen. IMPRESSION: No acute cardiopulmonary process seen. Electronically Signed   By: Roanna Raider M.D.   On: 08/23/2015 01:26    Procedures Procedures (including critical care time)  Medications Ordered in ED Medications  acetaminophen (TYLENOL) tablet 1,000 mg (not administered)     Initial Impression / Assessment and Plan / ED Course  I have reviewed the triage vital signs and the nursing notes.   Clinical Course   Patient with history of schizoaffective disorder presents with ongoing suicidal ideation and homicidal ideation as well as auditory hallucinations. Family has stated over the phone that they are afraid for their safety. He was calm and cooperative on exam with reassuring vital  signs. Because his family's concerns and the patient's endorsement of ongoing SI and HI, consulted TTS for evaluation. Patient's dispo will be determined by psychiatry team recs.  Final Clinical Impressions(s) / ED Diagnoses   Final diagnoses:  None    New Prescriptions New Prescriptions   No medications on file     Laurence Spates, MD 08/24/15 2335

## 2015-08-24 NOTE — BH Assessment (Signed)
Spoke with CSW who states that patient is unable to return to her home at this time. Informed patient that he is unable to return and he states "ok." Provided a list of local shelters and informed patient that he could use the ED phone to contact the shelters to see if they have an available bed and patient states "ok." Patient denies questions or concerns at this time. Informed patiens nurse that he was given appropriate information and told that he is not able to return to his grandmothers home at this time.   Mike PokeJoVea Masaye Gatchalian, LCSW Therapeutic Triage Specialist [facility] 08/24/2015 10:58 AM

## 2015-08-24 NOTE — Consult Note (Signed)
[facility] Consult   Reason for Consult:  Depression Referring Physician:  ED physician  Mike [name] Identification: Mike Mike [name] MRN:  [id number] Principal Diagnosis: schizoaffective disorder, bipolar type Diagnosis:   Mike [name] Active Problem List   Diagnosis Date Noted  . Intermittent explosive disorder [F63.81] 06/24/2015  . Schizoaffective disorder, bipolar type ([place]) [F25.0] 06/23/2015    Total Time spent with Mike [name]: 30 minutes  Subjective:   Mike Mike [name] is a 35 y.o. male Mike [name] admitted with depression  HPI: Mike [name] is a 35 year old man, presented to ED complaining of cough, malaise . ( Chest X Ray negative , no leukocytosis, no fever )  He also reported passive suicidal ideations, no plan or intent. He also reported intermittent auditory hallucinations, telling him to " punching walls".  He reported these hallucinations are chronic . At this time Mike [name] presents calm, fairly related, with limited eye contact, states he is feeling " a little better " but still depressed . At this time Mike [name] reports he has been depressed, but denies any suicidal plan /intention. States he has been " lying around , not doing much " recently, partly due to feeling ill recently. States " my little nephew was sick, and I think I caught what he had ". Mike [name] has been prescribed Haldol /Trileptal. At this time compliance with medications is unclear as he has  reportedly  made  different statements to staff regarding whether he was taking his medications regularly . Mike [name] denies any drug or alcohol abuse - BAL is <5, UDS is positive for cannabis  Mike [name] lives with grandmother,lives with grandmother, and is on SSI. Has three children, who live with mother . He is originally from [place] .   Mike [name] was started on medications yesterday along with an injectable Haldol.  He has remained calm and cooperative.  His grandmother called yesterday and evidently he "tore up" her place where she  lives and allows him to live.  Mike Mike [name] reported to TTS that he needed time to deescalate.  Yesterday, he ate multiple snacks and watched television.  No suicidal/homicidal ideations, hallucinations, or alcohol/drug abuse.  Stable for discharge after discussing possible eviction in Mike future if he continues to tear up his grandmother's house.  Past Psychiatric History: Mike [name]  has a history of prior psychiatric treatment , was hospitalized in June 2017 at [facility]. At Mike time was diagnosed with Schizoaffective Disorder, and was IVCd by family due to aggressive , explosive behaviors, breaking items at home, and cutting self . Reports indicated he has a history of violence and aggression . He was stabilized on Haloperidol,  Cogentin , Trileptal,  and received Haldol Decanoate 100 mgrs IM x 1 on 07/28/15.   Risk to Self: Suicidal Ideation:  (Passive) Suicidal Intent: No Is Mike [name] at risk for suicide?: Yes Suicidal Plan?: No Access to Means: No What has been your use of drugs/alcohol within Mike last 12 months?: THC How many times?: 1 (8 years ago gesture - thought about hanging himself ) Other Self Harm Risks: Denies Triggers for Past Attempts: Other (Comment) (being in [place];) Intentional Self Injurious Behavior: None Risk to Others: Homicidal Ideation: No Thoughts of Harm to Others: No Current Homicidal Intent: No Current Homicidal Plan: No Access to Homicidal Means: No Identified Victim: Denies History of harm to others?: No Assessment of Violence: None Noted Violent Behavior Description: Denies Does Mike [name] have access to weapons?: No Criminal Charges Pending?: No Does Mike [name] have a court date: No Prior Inpatient Therapy: Prior  Inpatient Therapy: Yes Prior Therapy Dates: 2017 Prior Therapy Facilty/Provider(s): [facility] Reason for Treatment: Schizophrenia (Haldol, Cogentin, Trazadone) Prior Outpatient Therapy: Prior Outpatient Therapy: No Prior Therapy Dates: N/A Prior Therapy  Facilty/Provider(s): N/A Reason for Treatment: N/A Does Mike [name] have an ACCT team?: No Does Mike [name] have Intensive In-House Services?  : No Does Mike [name] have [facility] services? : No Does Mike [name] have P4CC services?: No  Past Medical History:  Past Medical History:  Diagnosis Date  . Schizophrenia ([place])    History reviewed. No pertinent surgical history. Family History: No family history on file. Family Psychiatric  History: does not endorse  Social History:  History  Alcohol Use  . Yes     History  Drug Use  . Types: Cocaine, Marijuana    Social History   Social History  . Marital status: Single    Spouse name: N/A  . Number of children: N/A  . Years of education: N/A   Social History Main Topics  . Smoking status: Never Smoker  . Smokeless tobacco: Never Used  . Alcohol use Yes  . Drug use:     Types: Cocaine, Marijuana  . Sexual activity: Not Asked   Other Topics Concern  . None   Social History Narrative  . None   Additional Social History:    Allergies:  No Known Allergies  Labs:  Results for orders placed or performed during Mike hospital encounter of 08/23/15 (from Mike past 48 hour(s))  Rapid urine drug screen (hospital performed)     Status: Abnormal   Collection Time: 08/23/15 12:40 AM  Result Value Ref Range   Opiates NONE DETECTED NONE DETECTED   Cocaine NONE DETECTED NONE DETECTED   Benzodiazepines NONE DETECTED NONE DETECTED   Amphetamines NONE DETECTED NONE DETECTED   Tetrahydrocannabinol POSITIVE (A) NONE DETECTED   Barbiturates NONE DETECTED NONE DETECTED    Comment:        DRUG SCREEN FOR MEDICAL PURPOSES ONLY.  IF CONFIRMATION IS NEEDED FOR ANY PURPOSE, NOTIFY LAB WITHIN 5 DAYS.        LOWEST DETECTABLE LIMITS FOR URINE DRUG SCREEN Drug Class       Cutoff (ng/mL) Amphetamine      1000 Barbiturate      200 Benzodiazepine   659 Tricyclics       935 Opiates          300 Cocaine          300 THC              50   Comprehensive  metabolic panel     Status: Abnormal   Collection Time: 08/23/15 12:51 AM  Result Value Ref Range   Sodium 135 135 - 145 mmol/L   Potassium 3.3 (L) 3.5 - 5.1 mmol/L   Chloride 108 101 - 111 mmol/L   CO2 21 (L) 22 - 32 mmol/L   Glucose, Bld 106 (H) 65 - 99 mg/dL   BUN 11 6 - 20 mg/dL   Creatinine, Ser 1.12 0.61 - 1.24 mg/dL   Calcium 9.3 8.9 - 10.3 mg/dL   Total Protein 8.0 6.5 - 8.1 g/dL   Albumin 4.3 3.5 - 5.0 g/dL   AST 20 15 - 41 U/L   ALT 22 17 - 63 U/L   Alkaline Phosphatase 44 38 - 126 U/L   Total Bilirubin 0.8 0.3 - 1.2 mg/dL   GFR calc non Af Amer >60 >60 mL/min   GFR calc Af Amer >60 >60 mL/min  Comment: (NOTE) Mike eGFR has been calculated using Mike CKD EPI equation. This calculation has not been validated in all clinical situations. eGFR's persistently <60 mL/min signify possible Chronic Kidney Disease.    Anion gap 6 5 - 15  Ethanol     Status: None   Collection Time: 08/23/15 12:51 AM  Result Value Ref Range   Alcohol, Ethyl (B) <5 <5 mg/dL    Comment:        LOWEST DETECTABLE LIMIT FOR SERUM ALCOHOL IS 5 mg/dL FOR MEDICAL PURPOSES ONLY   Salicylate level     Status: None   Collection Time: 08/23/15 12:51 AM  Result Value Ref Range   Salicylate Lvl <0.1 2.8 - 30.0 mg/dL  Acetaminophen level     Status: Abnormal   Collection Time: 08/23/15 12:51 AM  Result Value Ref Range   Acetaminophen (Tylenol), Serum <10 (L) 10 - 30 ug/mL    Comment:        THERAPEUTIC CONCENTRATIONS VARY SIGNIFICANTLY. A RANGE OF 10-30 ug/mL MAY BE AN EFFECTIVE CONCENTRATION FOR MANY PATIENTS. HOWEVER, SOME ARE BEST TREATED AT CONCENTRATIONS OUTSIDE THIS RANGE. ACETAMINOPHEN CONCENTRATIONS >150 ug/mL AT 4 HOURS AFTER INGESTION AND >50 ug/mL AT 12 HOURS AFTER INGESTION ARE OFTEN ASSOCIATED WITH TOXIC REACTIONS.   cbc     Status: Abnormal   Collection Time: 08/23/15 12:51 AM  Result Value Ref Range   WBC 10.1 4.0 - 10.5 K/uL   RBC 5.69 4.22 - 5.81 MIL/uL   Hemoglobin 18.1  (H) 13.0 - 17.0 g/dL   HCT 50.2 39.0 - 52.0 %   MCV 88.2 78.0 - 100.0 fL   MCH 31.8 26.0 - 34.0 pg   MCHC 36.1 (H) 30.0 - 36.0 g/dL   RDW 12.6 11.5 - 15.5 %   Platelets 237 150 - 400 K/uL    Current Facility-Administered Medications  Medication Dose Route Frequency Provider Last Rate Last Dose  . acetaminophen (TYLENOL) tablet 650 mg  650 mg Oral Q4H PRN Charlann Lange, PA-C      . benztropine (COGENTIN) tablet 0.5 mg  0.5 mg Oral BID Patrecia Pour, NP   0.5 mg at 08/23/15 2102  . haloperidol (HALDOL) tablet 10 mg  10 mg Oral QPM Patrecia Pour, NP   10 mg at 08/23/15 1751  . haloperidol (HALDOL) tablet 5 mg  5 mg Oral Daily Patrecia Pour, NP   5 mg at 08/23/15 1213  . haloperidol decanoate (HALDOL DECANOATE) 100 MG/ML injection 100 mg  100 mg Intramuscular Q28 days Patrecia Pour, NP   100 mg at 08/23/15 1213  . LORazepam (ATIVAN) tablet 1 mg  1 mg Oral Q8H PRN Shari Upstill, PA-C      . nicotine (NICODERM CQ - dosed in mg/24 hours) patch 21 mg  21 mg Transdermal Daily Shari Upstill, PA-C      . ondansetron (ZOFRAN) tablet 4 mg  4 mg Oral Q8H PRN Charlann Lange, PA-C      . Oxcarbazepine (TRILEPTAL) tablet 300 mg  300 mg Oral BID Patrecia Pour, NP   300 mg at 08/23/15 2104  . potassium chloride (K-DUR,KLOR-CON) CR tablet 10 mEq  10 mEq Oral BID Jenne Campus, MD   10 mEq at 08/23/15 2103  . traZODone (DESYREL) tablet 125 mg  125 mg Oral QHS Patrecia Pour, NP   125 mg at 08/23/15 2103   Current Outpatient Prescriptions  Medication Sig Dispense Refill  . benztropine (COGENTIN) 0.5 MG tablet Take 1  tablet (0.66m) in Mike morning & at bedtime: For prevention of drug induced tremors 60 tablet 0  . haloperidol (HALDOL) 1 MG tablet Take 7 tablets (7 mg total) by mouth daily. For mood control (Mike [name] taking differently: Take 5 mg by mouth daily. For mood control) 30 tablet 0  . haloperidol (HALDOL) 10 MG tablet Take 1 tablet (10 mg total) by mouth every evening. For mood control 30 tablet 0   . traZODone (DESYREL) 150 MG tablet Take 1 tablet (150 mg total) by mouth at bedtime. For sleep 30 tablet 0  . haloperidol decanoate (HALDOL DECANOATE) 100 MG/ML injection Inject 1 mL (100 mg total) into Mike muscle every 28 (twenty-eight) days. (Due to be administered on 07-28-15): For mood control (Mike [name] not taking: Reported on 08/23/2015) 1 mL 0  . nicotine (NICODERM CQ - DOSED IN MG/24 HOURS) 21 mg/24hr patch Place 1 patch (21 mg total) onto Mike skin daily. For smoking cessation (Mike [name] not taking: Reported on 08/23/2015) 28 patch 0  . Oxcarbazepine (TRILEPTAL) 300 MG tablet Take 1 tablet (300 mg) in Mike morning & 2 tablets (600 mg) at bedtime: for mood stabilization (Mike [name] not taking: Reported on 08/23/2015) 90 tablet 0    Musculoskeletal: Strength & Muscle Tone: within normal limits Gait & Station: not assessed, Mike [name] in bed  Mike [name] leans: N/A  Psychiatric Specialty Exam: Physical Exam  Constitutional: He is oriented to person, place, and time. He appears well-developed and well-nourished.  HENT:  Head: Normocephalic.  Neck: Normal range of motion.  Respiratory: Effort normal.  Musculoskeletal: Normal range of motion.  Neurological: He is alert and oriented to person, place, and time.  Skin: Skin is warm and dry.  Psychiatric: He has a normal mood and affect. His speech is normal and behavior is normal. Judgment normal. Cognition and memory are normal.    Review of Systems  Constitutional: Negative.   HENT: Negative.   Eyes: Negative.   Respiratory: Negative.   Cardiovascular: Negative.   Gastrointestinal: Negative.   Genitourinary: Negative.   Musculoskeletal: Negative.   Skin: Negative.   Neurological: Negative.   Endo/Heme/Allergies: Negative.   Psychiatric/Behavioral: Negative.     No headache, describes cough x 1-2 weeks, productive, denies shortness of breath, no fever or chills, no rash   Blood pressure 120/75, pulse 86, temperature 98.4 F (36.9 C),  temperature source Oral, resp. rate 18, SpO2 96 %.There is no height or weight on file to calculate BMI.  General Appearance: Fairly Groomed  Eye Contact:  Good  Speech:  Normal Rate  Volume:  Normal  Mood:  Euthymic  Affect: Congruent  Thought Process: WDL  Orientation:  Alert and oriented x 3   Thought Content:  WDL  Suicidal Thoughts:  No  Homicidal Thoughts:  No-   Memory:  Recent, good; remote, good; immediate, good  Judgement:  Fair  Insight:  Fair  Psychomotor Activity:  WDL  Concentration:  Concentration: Good and Attention Span: Good  Recall:  Good  Fund of Knowledge:  Good  Language:  Good  Akathisia:  Negative  Handed:  Right  AIMS (if indicated):     Assets:  Desire for Improvement Resilience housing, social support  ADL's:  Intact  Cognition:  WNL  Sleep:        Treatment Plan Summary: Daily contact with Mike [name] to assess and evaluate symptoms and progress in treatment and Medication management  Disposition: No evidence of imminent risk to self or others at present.   Will monitor in  ED and reassess regularly. He is being managed with Haldol, Trileptal, which as noted he did well on during a recent admission . Will discuss/encourage Haldol Decanoate injection. Monitor EKG to rule out QT c prolongation and correct hypokalemia .     Waylan Boga, NP 08/24/2015 9:11 AM   Agree with NP note, plan

## 2015-08-24 NOTE — ED Notes (Signed)
Written dc instructions and prescriptions reviewed with patient.  Pt encouraged to take medications as directed and follow up with OP referals has had been given..  Pt denies si/hi/avh at this time, and was encouraged to seek treatment for any return of the si/hi/avh thoughts or urges.  Pt verbalized understanding and reported he would do so.  Pt is also aware that he can not return to his grandmothers.  Bus pass given, shelter/IRC information given by TTS.  Pt ambulatory w/o difficulty to dc area w/ security.  Belongings returned after leaving the area.

## 2015-08-24 NOTE — ED Notes (Signed)
Security called to wand patient 

## 2015-08-24 NOTE — Discharge Instructions (Signed)
Patient to be discharged to follow up with [facility] or other angencies listed below.   Behavioral [facility] Center   AGENCIES  [facility] (Medication management, therapy, and crisis services) ACCESS LINE:  [phone] or [phone] [facility] [address] [place], [place] [address]  [facility] (Medication management and therapy) [address] [place], [place] [address] [phone]  [facility] (Medication management and therapy) [address] [place], [place] [address] [phone]  THERAPISTS (No medication management)  [facility]  [facility] Call for location of [place] office              [address] [phone]      [place], [place] [address]        [phone]  MOBILE CRISIS TEAMS  [facility] Mobile Crisis Care Unit [phone]   These referrals have been provided to you as appropriate for your clinical needs while taking into account your financial concerns. Please be aware that agencies, practitioners and insurance companies sometimes change contracts. When calling to make an appointment have your insurance information available so the professional you are going to see can confirm whether they are covered by your plan. Take this form with you in case the person you are seeing needs a copy or to contact us.

## 2015-08-24 NOTE — Progress Notes (Signed)
Disposition CSW contacted this patient's Grandmother as requested by her and the medical staff at [facility].  The Grandmother states she has tried to help him and, "I am done.  He has my nerves frazzled."  She states she has packed her things and left her house and will be out of town.  "At the end of the month I will give him his things, his money, and a bus ticket back to [place]."  Patients Grandmother has asked this CSW to send patient to a shelter.  CSW let the Grandmother know that no shelter is available and that if he comes to her house she may need to contact the police if he escalates, but that at this point he has been started on an injectable and is not meeting criteria for hospitalization.  She states she understands, but he refuses to take his medication when home and then he starts using drugs that get him agitated.  She again states she is not currently home.  CSW will report to [facility] medical team.  Recommendation from the CSW is to give him a bus pass and shelter resources in case he cannot get in his residence.   Seward SpeckLeo Brea[facility],LCAS Behavioral Health Disposition CSW [phone]

## 2015-08-24 NOTE — BH Assessment (Addendum)
Tele Assessment Note   Mike [name] is an 35 y.o. male presenting voluntarily and unaccompanied for assessment. Pt reports auditory hallucinations with command to commit suicide. Pt presented to ED on 8.12.17 with similar complaints and was psychiatrically cleared for discharge on 8.13.17. Pt states he was walking after discharge and crossed a bridge when experienced increased auditory hallucinations and urges to jump off. Pt has history of schizoaffective, bipolar type. Pt reports non-compliance with medications and no outpatient providers. Pt reports history of once suicidal gesture (contemplated hanging self 40yrs ago). Pt has history of one inpatient admission at [facility]. Pt has history of visual hallucinations. Pt denies homicidal ideation and history of aggression.  Pt reports use of THC.  Pt resides with grandmother and is unable to return to residence. Pt does not appear to be responding to internal stimuli or experiencing delusional thought content during assessment interview.   Diagnosis: F25.0 Schizoaffective disorder, bipolar type  Past Medical History:  Past Medical History:  Diagnosis Date  . Schizophrenia (HCC)     History reviewed. No pertinent surgical history.  Family History: History reviewed. No pertinent family history.  Social History:  reports that he has been smoking Cigarettes.  He has been smoking about 1.50 packs per day. He has never used smokeless tobacco. He reports that he drinks alcohol. He reports that he uses drugs, including Cocaine and Marijuana.  Additional Social History:  Alcohol / Drug Use Pain Medications: Denies Prescriptions: Denies Over the Counter: Denies History of alcohol / drug use?: Yes Longest period of sobriety (when/how long): "weeks" Negative Consequences of Use:  (None reported) Withdrawal Symptoms:  (None endorsed by pt) Substance #1 Name of Substance 1: THC 1 - Age of First Use: 20 1 - Amount (size/oz): 2 blunts 1 - Last Use /  Amount: 8.11.17/Not Reported  CIWA: CIWA-Ar BP: 132/98 Pulse Rate: 109 COWS:    PATIENT STRENGTHS: (choose at least two) Average or above average intelligence Communication skills  Allergies: No Known Allergies  Home Medications:  (Not in a hospital admission)  OB/GYN Status:  No LMP for male patient.  General Assessment Data Location of Assessment: [facility] TTS Assessment: In system Is this a Tele or Face-to-Face Assessment?: Face-to-Face Is this an Initial Assessment or a Re-assessment for this encounter?: Initial Assessment Marital status: Single Is patient pregnant?: No Pregnancy Status: No Living Arrangements: Other relatives (Grandmother) Can pt return to current living arrangement?: No Admission Status: Voluntary Is patient capable of signing voluntary admission?: Yes Referral Source: Self/Family/Friend Insurance type: Medicaid     Crisis Care Plan Living Arrangements: Other relatives Database administrator) Name of Psychiatrist: None Name of Therapist: None  Education Status Is patient currently in school?: No Highest grade of school patient has completed: Some College  Risk to self with the past 6 months Suicidal Ideation: Yes-Currently Present Has patient been a risk to self within the past 6 months prior to admission? : Yes Suicidal Intent: No Has patient had any suicidal intent within the past 6 months prior to admission? : Yes Is patient at risk for suicide?: Yes Suicidal Plan?: Yes-Currently Present Has patient had any suicidal plan within the past 6 months prior to admission? : Yes Specify Current Suicidal Plan: Jump off of bridge Access to Means: Yes Specify Access to Suicidal Means: Access to bridges and overpassess What has been your use of drugs/alcohol within the last 12 months?: Pt reports THC use Previous Attempts/Gestures: Yes How many times?: 1 (Gesture 56yrs ago- contemplated hanging self) Other  Self Harm Risks: d Triggers for Past Attempts: Other  (Comment) (Incarceration) Intentional Self Injurious Behavior: None Family Suicide History: No Recent stressful life event(s): Conflict (Comment) (Conflict with grandmother) Persecutory voices/beliefs?: Yes Depression: Yes Depression Symptoms: Isolating, Feeling worthless/self pity, Feeling angry/irritable Substance abuse history and/or treatment for substance abuse?: Yes Suicide prevention information given to non-admitted patients: Not applicable  Risk to Others within the past 6 months Homicidal Ideation: No Does patient have any lifetime risk of violence toward others beyond the six months prior to admission? : No Thoughts of Harm to Others: No Current Homicidal Intent: No Current Homicidal Plan: No Access to Homicidal Means: No History of harm to others?: No Assessment of Violence: None Noted Does patient have access to weapons?: No Criminal Charges Pending?: No Does patient have a court date: No Is patient on probation?: No  Psychosis Hallucinations: Auditory, With command (w/command to kill self) Delusions: None noted  Mental Status Report Appearance/Hygiene: In scrubs Eye Contact: Good Motor Activity: Unremarkable Speech: Logical/coherent Level of Consciousness: Alert Mood: Euthymic Affect: Appropriate to circumstance Thought Processes: Coherent, Relevant Judgement: Unimpaired Orientation: Person, Place, Time, Situation Obsessive Compulsive Thoughts/Behaviors: None  Cognitive Functioning Concentration: Good Memory: Recent Intact, Remote Intact IQ: Above Average Insight: Fair Impulse Control: Fair Appetite: Fair Weight Loss: 0 Weight Gain: 0 Sleep: No Change Total Hours of Sleep: 6 Vegetative Symptoms: None  ADLScreening [facility] Assessment Services) Patient's cognitive ability adequate to safely complete daily activities?: Yes Patient able to express need for assistance with ADLs?: Yes Independently performs ADLs?: Yes (appropriate for developmental  age)  Prior Inpatient Therapy Prior Inpatient Therapy: Yes Prior Therapy Dates: 2017 Prior Therapy Facilty/Provider(s): [facility] Reason for Treatment: Schizophrenia  Prior Outpatient Therapy Prior Outpatient Therapy: No Prior Therapy Dates: N/A Prior Therapy Facilty/Provider(s): N/A Reason for Treatment: N/A Does patient have an ACCT team?: No Does patient have Intensive In-House Services?  : No Does patient have [facility] services? : No Does patient have P4CC services?: No  ADL Screening (condition at time of admission) Patient's cognitive ability adequate to safely complete daily activities?: Yes Is the patient deaf or have difficulty hearing?: No Does the patient have difficulty seeing, even when wearing glasses/contacts?: No Does the patient have difficulty concentrating, remembering, or making decisions?: No Patient able to express need for assistance with ADLs?: Yes Does the patient have difficulty dressing or bathing?: No Independently performs ADLs?: Yes (appropriate for developmental age) Does the patient have difficulty walking or climbing stairs?: No Weakness of Legs: None Weakness of Arms/Hands: None  Home Assistive Devices/Equipment Home Assistive Devices/Equipment: None  Therapy Consults (therapy consults require a physician order) PT Evaluation Needed: No OT Evalulation Needed: No SLP Evaluation Needed: No Abuse/Neglect Assessment (Assessment to be complete while patient is alone) Physical Abuse: Denies Verbal Abuse: Denies Sexual Abuse: Denies Exploitation of patient/patient's resources: Denies Self-Neglect: Denies Values / Beliefs Cultural Requests During Hospitalization: None Spiritual Requests During Hospitalization: None Consults Spiritual Care Consult Needed: No Social Work Consult Needed: No Merchant navy officer (For Healthcare) Does patient have an advance directive?: No Would patient like information on creating an advanced directive?: No - patient  declined information    Additional Information 1:1 In Past 12 Months?: No CIRT Risk: No Elopement Risk: No Does patient have medical clearance?: No     Disposition: Per Alberteen Sam, NP pt meets criteria for inpatient admission. Clinician confirmed lack of [facility] bed availability with Clint Bolder, AC. TTS to seek placement. Darel Hong, RN informed of pt disposition. Disposition Initial Assessment Completed  for this Encounter: Yes Disposition of Patient: Other dispositions (per Donell SievertSpencer Simon, PA-C) Other disposition(s): Other (Comment) (Pending Psychiatric Recommendation)  Vennie Waymire J [place] 08/24/2015 10:05 PM

## 2015-08-25 DIAGNOSIS — F250 Schizoaffective disorder, bipolar type: Secondary | ICD-10-CM

## 2015-08-25 DIAGNOSIS — F6381 Intermittent explosive disorder: Secondary | ICD-10-CM

## 2015-08-25 NOTE — BH Assessment (Signed)
[facility] Assessment Progress Note  Per Nelly RoutArchana Kumar, MD, this pt does not require psychiatric hospitalization at this time.  [facility]  He is also to be provided with information for supportive services for the homeless.  [facility] information has been included in pt's discharge instructions.  Pt's nurse, Diane, has been given printed information about services for the homeless to offer to the pt, and notified of pt's disposition.  Doylene Canninghomas Mellody Masri, MA Triage Specialist [phone]

## 2015-08-25 NOTE — ED Notes (Signed)
Pt denies SI/HI, is not delusional and not responding to internal stimuli. Pt given resources for f/u care at [facility] and homeless shelters. Pt discharged with a bus pass and instructions.

## 2015-08-25 NOTE — Consult Note (Signed)
[facility] Face-to-Face Psychiatry Consult   Reason for Consult:  Depression Referring Physician:  ED physician  Patient Identification: Nat ChristenDonnell Carradine MRN:  [id number] Principal Diagnosis: schizoaffective disorder, bipolar type Diagnosis:   Patient Active Problem List   Diagnosis Date Noted  . Intermittent explosive disorder [F63.81] 06/24/2015    Priority: High  . Schizoaffective disorder, bipolar type (HCC) [F25.0] 06/23/2015    Priority: High    Total Time spent with patient: 30 minutes  Subjective:   Nat ChristenDonnell Houchins is a 35 y.o. male patient admitted with depression  HPI: Patient is a 35 year old man, presented to ED complaining of cough, malaise . ( Chest X Ray negative , no leukocytosis, no fever )  He also reported passive suicidal ideations, no plan or intent. He also reported intermittent auditory hallucinations, telling him to " punching walls".  He reported these hallucinations are chronic . At this time patient presents calm, fairly related, with limited eye contact, states he is feeling " a little better " but still depressed . At this time patient reports he has been depressed, but denies any suicidal plan /intention. States he has been " lying around , not doing much " recently, partly due to feeling ill recently. States " my little nephew was sick, and I think I caught what he had ". Patient has been prescribed Haldol /Trileptal. At this time compliance with medications is unclear as he has  reportedly  made  different statements to staff regarding whether he was taking his medications regularly . Patient denies any drug or alcohol abuse - BAL is <5, UDS is positive for cannabis  Patient lives with grandmother,lives with grandmother, and is on SSI. Has three children, who live with mother . He is originally from [place] .   Patient was started on medications 2 days along with an injectable Haldol.  He has remained calm and cooperative.  His grandmother called yesterday and evidently  he "tore up" her place where she lives and allows him to live.  The patient reported to TTS that he needed time to deescalate.  Yesterday, he ate multiple snacks and watched television.  No suicidal/homicidal ideations, hallucinations, or alcohol/drug abuse.  Stable for discharge after discussing possible eviction in the future if he continues to tear up his grandmother's house.  Today, patient returned last night after choosing not to go to the shelter but return here.  He is not suicidal or homicidal, hallucinating, or abusing drugs.  [name] was encouraged to follow the plan to go to the shelter and outpatient [facility].  Past Psychiatric History: Patient  has a history of prior psychiatric treatment , [facility]. At the time was diagnosed with Schizoaffective Disorder, and was IVCd by family due to aggressive , explosive behaviors, breaking items at home, and cutting self . Reports indicated he has a history of violence and aggression . He was stabilized on Haloperidol,  Cogentin , Trileptal,  and received Haldol Decanoate 100 mgrs IM x 1 on 07/28/15.   Risk to Self: Suicidal Ideation: Yes-Currently Present Suicidal Intent: No Is patient at risk for suicide?: Yes Suicidal Plan?: Yes-Currently Present Specify Current Suicidal Plan: Jump off of bridge Access to Means: Yes Specify Access to Suicidal Means: Access to bridges and overpassess What has been your use of drugs/alcohol within the last 12 months?: Pt reports THC use How many times?: 1 (Gesture 2244yrs ago- contemplated hanging self) Other Self Harm Risks: d Triggers for Past Attempts:  Other (Comment) (Incarceration) Intentional Self Injurious Behavior: None Risk to Others: Homicidal Ideation: No Thoughts of Harm to Others: No Current Homicidal Intent: No Current Homicidal Plan: No Access to Homicidal Means: No History of harm to others?: No Assessment of Violence: None Noted Does patient have access to  weapons?: No Criminal Charges Pending?: No Does patient have a court date: No Prior Inpatient Therapy: Prior Inpatient Therapy: Yes Prior Therapy Dates: 2017 Prior Therapy Facilty/Provider(s): [facility] Reason for Treatment: Schizophrenia Prior Outpatient Therapy: Prior Outpatient Therapy: No Prior Therapy Dates: N/A Prior Therapy Facilty/Provider(s): N/A Reason for Treatment: N/A Does patient have an ACCT team?: No Does patient have Intensive In-House Services?  : No Does patient have [facility] services? : No Does patient have P4CC services?: No  Past Medical History:  Past Medical History:  Diagnosis Date  . Schizophrenia (HCC)    History reviewed. No pertinent surgical history. Family History: History reviewed. No pertinent family history. Family Psychiatric  History: does not endorse  Social History:  History  Alcohol Use  . Yes    Comment: social, "a cup of beer today" 08/24/2015     History  Drug Use  . Types: Cocaine, Marijuana    Comment: denies 08/24/2015    Social History   Social History  . Marital status: Single    Spouse name: N/A  . Number of children: N/A  . Years of education: N/A   Social History Main Topics  . Smoking status: Current Every Day Smoker    Packs/day: 1.50    Types: Cigarettes  . Smokeless tobacco: Never Used  . Alcohol use Yes     Comment: social, "a cup of beer today" 08/24/2015  . Drug use:     Types: Cocaine, Marijuana     Comment: denies 08/24/2015  . Sexual activity: Not Asked   Other Topics Concern  . None   Social History Narrative  . None   Additional Social History:    Allergies:  No Known Allergies  Labs:  No results found for this or any previous visit (from the past 48 hour(s)).  Current Facility-Administered Medications  Medication Dose Route Frequency Provider Last Rate Last Dose  . acetaminophen (TYLENOL) tablet 650 mg  650 mg Oral Q4H PRN Elpidio Anis, PA-C   650 mg at 08/24/15 2223  . benztropine  (COGENTIN) tablet 0.5 mg  0.5 mg Oral BID Kristeen Mans, NP   0.5 mg at 08/25/15 1610  . haloperidol (HALDOL) tablet 10 mg  10 mg Oral QPM Kristeen Mans, NP      . haloperidol (HALDOL) tablet 5 mg  5 mg Oral Daily Kristeen Mans, NP   5 mg at 08/25/15 0950  . LORazepam (ATIVAN) tablet 1 mg  1 mg Oral Q8H PRN Elpidio Anis, PA-C      . traZODone (DESYREL) tablet 150 mg  150 mg Oral QHS Kristeen Mans, NP       Current Outpatient Prescriptions  Medication Sig Dispense Refill  . benztropine (COGENTIN) 0.5 MG tablet Take 1 tablet (0.5mg ) in the morning & at bedtime: For prevention of drug induced tremors 60 tablet 0  . haloperidol (HALDOL) 1 MG tablet Take 7 tablets (7 mg total) by mouth daily. For mood control (Patient taking differently: Take 5 mg by mouth daily. For mood control) 30 tablet 0  . Oxcarbazepine (TRILEPTAL) 300 MG tablet Take 1 tablet (300 mg) in the morning & 2 tablets (600 mg) at bedtime: for mood stabilization 60 tablet  0  . haloperidol (HALDOL) 10 MG tablet Take 1 tablet (10 mg total) by mouth every evening. For mood control (Patient not taking: Reported on 08/24/2015) 14 tablet 0  . haloperidol (HALDOL) 5 MG tablet Take 1 tablet (5 mg total) by mouth daily. (Patient not taking: Reported on 08/24/2015) 14 tablet 0  . haloperidol decanoate (HALDOL DECANOATE) 100 MG/ML injection Inject 1 mL (100 mg total) into the muscle every 28 (twenty-eight) days. (Due to be administered on 07-28-15): For mood control (Patient not taking: Reported on 08/23/2015) 1 mL 0  . nicotine (NICODERM CQ - DOSED IN MG/24 HOURS) 21 mg/24hr patch Place 1 patch (21 mg total) onto the skin daily. For smoking cessation (Patient not taking: Reported on 08/23/2015) 28 patch 0  . traZODone (DESYREL) 150 MG tablet Take 1 tablet (150 mg total) by mouth at bedtime. For sleep (Patient not taking: Reported on 08/24/2015) 30 tablet 0    Musculoskeletal: Strength & Muscle Tone: within normal limits Gait & Station: not assessed,  patient in bed  Patient leans: N/A  Psychiatric Specialty Exam: Physical Exam  Constitutional: He is oriented to person, place, and time. He appears well-developed and well-nourished.  HENT:  Head: Normocephalic.  Neck: Normal range of motion.  Respiratory: Effort normal.  Musculoskeletal: Normal range of motion.  Neurological: He is alert and oriented to person, place, and time.  Skin: Skin is warm and dry.  Psychiatric: He has a normal mood and affect. His speech is normal and behavior is normal. Judgment normal. Cognition and memory are normal.    Review of Systems  Constitutional: Negative.   HENT: Negative.   Eyes: Negative.   Respiratory: Negative.   Cardiovascular: Negative.   Gastrointestinal: Negative.   Genitourinary: Negative.   Musculoskeletal: Negative.   Skin: Negative.   Neurological: Negative.   Endo/Heme/Allergies: Negative.   Psychiatric/Behavioral: Negative.     No headache, describes cough x 1-2 weeks, productive, denies shortness of breath, no fever or chills, no rash   Blood pressure 112/64, pulse 90, temperature 97.9 F (36.6 C), temperature source Oral, resp. rate 17, SpO2 100 %.There is no height or weight on file to calculate BMI.  General Appearance: Fairly Groomed  Eye Contact:  Good  Speech:  Normal Rate  Volume:  Normal  Mood:  Euthymic  Affect: Congruent  Thought Process: WDL  Orientation:  Alert and oriented x 3   Thought Content:  WDL  Suicidal Thoughts:  No  Homicidal Thoughts:  No-   Memory:  Recent, good; remote, good; immediate, good  Judgement:  Fair  Insight:  Fair  Psychomotor Activity:  WDL  Concentration:  Concentration: Good and Attention Span: Good  Recall:  Good  Fund of Knowledge:  Good  Language:  Good  Akathisia:  Negative  Handed:  Right  AIMS (if indicated):     Assets:  Desire for Improvement Resilience housing, social support  ADL's:  Intact  Cognition:  WNL  Sleep:        Treatment Plan Summary: Daily  contact with patient to assess and evaluate symptoms and progress in treatment and Medication management:  Schizoaffective disorder, bipolar typer: -Crisis stabilization -Medication management:  Continue Haldol 5 mg in am and 10 mg in the pm for mood and psychosis, Cogentin 1 mg BID for EPS, and Trazodone 150 mg at bedtime for sleep.  Started Ativan 1 mg every 8 hours PRN anxiety.   -Individual counseling  Disposition: No evidence of imminent risk to self or others  at present.   Will monitor in ED and reassess regularly.    Nanine MeansLORD, JAMISON, NP 08/25/2015 10:14 AM

## 2015-08-25 NOTE — Discharge Instructions (Signed)
For your ongoing mental health needs, you are advised to follow up with [facility].  New and returning patients are seen at their walk-in clinic.  Walk-in hours are Monday - Friday from 8:00 am - 3:00 pm.  Walk-in patients are seen on a first come, first served basis.  Try to arrive as early as possible for he best chance of being seen the same day: ° °     [facility] °     [address] °     [place], [place] [address] °     [phone] °

## 2015-08-25 NOTE — [facility] Suicide Risk Assessment (Signed)
Suicide Risk Assessment  Discharge Assessment   [facility]   Principal Problem: Schizoaffective disorder, bipolar type [facility](HCC) Discharge Diagnoses:  Patient Active Problem List   Diagnosis Date Noted  . Intermittent explosive disorder [F63.81] 06/24/2015    Priority: High  . Schizoaffective disorder, bipolar type (HCC) [F25.0] 06/23/2015    Priority: High    Total Time spent with patient: 45 minutes  Musculoskeletal: Strength & Muscle Tone: within normal limits Gait & Station: not assessed, patient in bed  Patient leans: N/A  Psychiatric Specialty Exam: Physical Exam  Constitutional: He is oriented to person, place, and time. He appears well-developed and well-nourished.  HENT:  Head: Normocephalic.  Neck: Normal range of motion.  Respiratory: Effort normal.  Musculoskeletal: Normal range of motion.  Neurological: He is alert and oriented to person, place, and time.  Skin: Skin is warm and dry.  Psychiatric: He has a normal mood and affect. His speech is normal and behavior is normal. Judgment normal. Cognition and memory are normal.    Review of Systems  Constitutional: Negative.   HENT: Negative.   Eyes: Negative.   Respiratory: Negative.   Cardiovascular: Negative.   Gastrointestinal: Negative.   Genitourinary: Negative.   Musculoskeletal: Negative.   Skin: Negative.   Neurological: Negative.   Endo/Heme/Allergies: Negative.   Psychiatric/Behavioral: Negative.     No headache, describes cough x 1-2 weeks, productive, denies shortness of breath, no fever or chills, no rash   Blood pressure 112/64, pulse 90, temperature 97.9 F (36.6 C), temperature source Oral, resp. rate 17, SpO2 100 %.There is no height or weight on file to calculate BMI.  General Appearance: Fairly Groomed  Eye Contact:  Good  Speech:  Normal Rate  Volume:  Normal  Mood:  Euthymic  Affect: Congruent  Thought Process: WDL  Orientation:  Alert and oriented x 3    Thought Content:  WDL  Suicidal Thoughts:  No  Homicidal Thoughts:  No-   Memory:  Recent, good; remote, good; immediate, good  Judgement:  Fair  Insight:  Fair  Psychomotor Activity:  WDL  Concentration:  Concentration: Good and Attention Span: Good  Recall:  Good  Fund of Knowledge:  Good  Language:  Good  Akathisia:  Negative  Handed:  Right  AIMS (if indicated):     Assets:  Desire for Improvement Resilience housing, social support  ADL's:  Intact  Cognition:  WNL  Sleep:       Mental Status Per Nursing Assessment::   On Admission:   depression   Demographic Factors:  Male and Living alone  Loss Factors: NA  Historical Factors: NA  Risk Reduction Factors:   Sense of responsibility to family and Positive therapeutic relationship  Continued Clinical Symptoms:  None  Cognitive Features That Contribute To Risk:  None    Suicide Risk:  Minimal: No identifiable suicidal ideation.  Patients presenting with no risk factors but with morbid ruminations; may be classified as minimal risk based on the severity of the depressive symptoms    Plan Of Care/Follow-up recommendations:  Activity:  as tolerated Diet:  heart healthy diet  Aracelis Ulrey, NP 08/25/2015, 11:11 AM

## 2017-08-18 IMAGING — CR DG CHEST 2V
2 series · 2 of 2 positions shown · non-contrast
Comparison: None.

CLINICAL DATA: Acute onset of productive cough and generalized
malaise. Initial encounter.

EXAM:
CHEST  2 VIEW

[w chest pa]
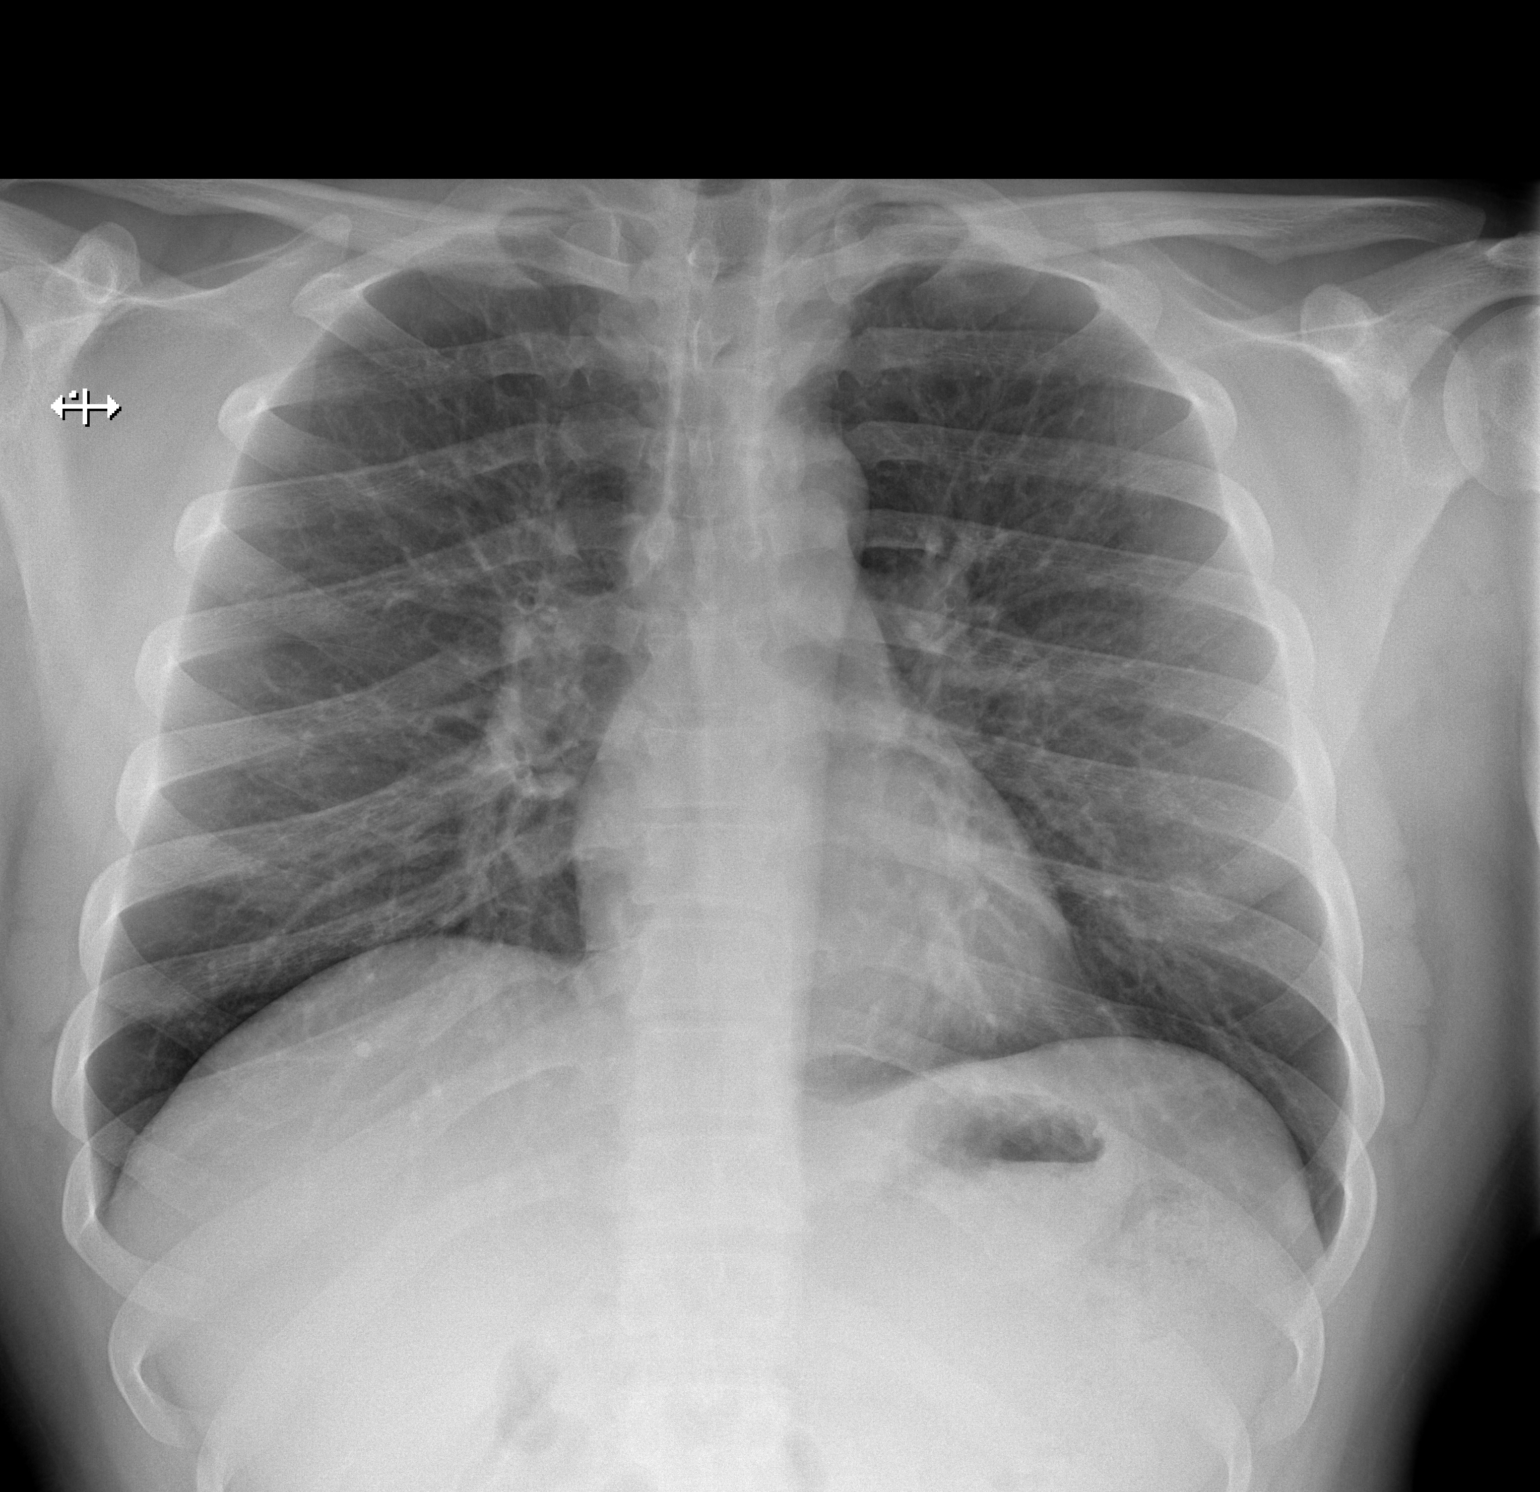

[w chest lat]
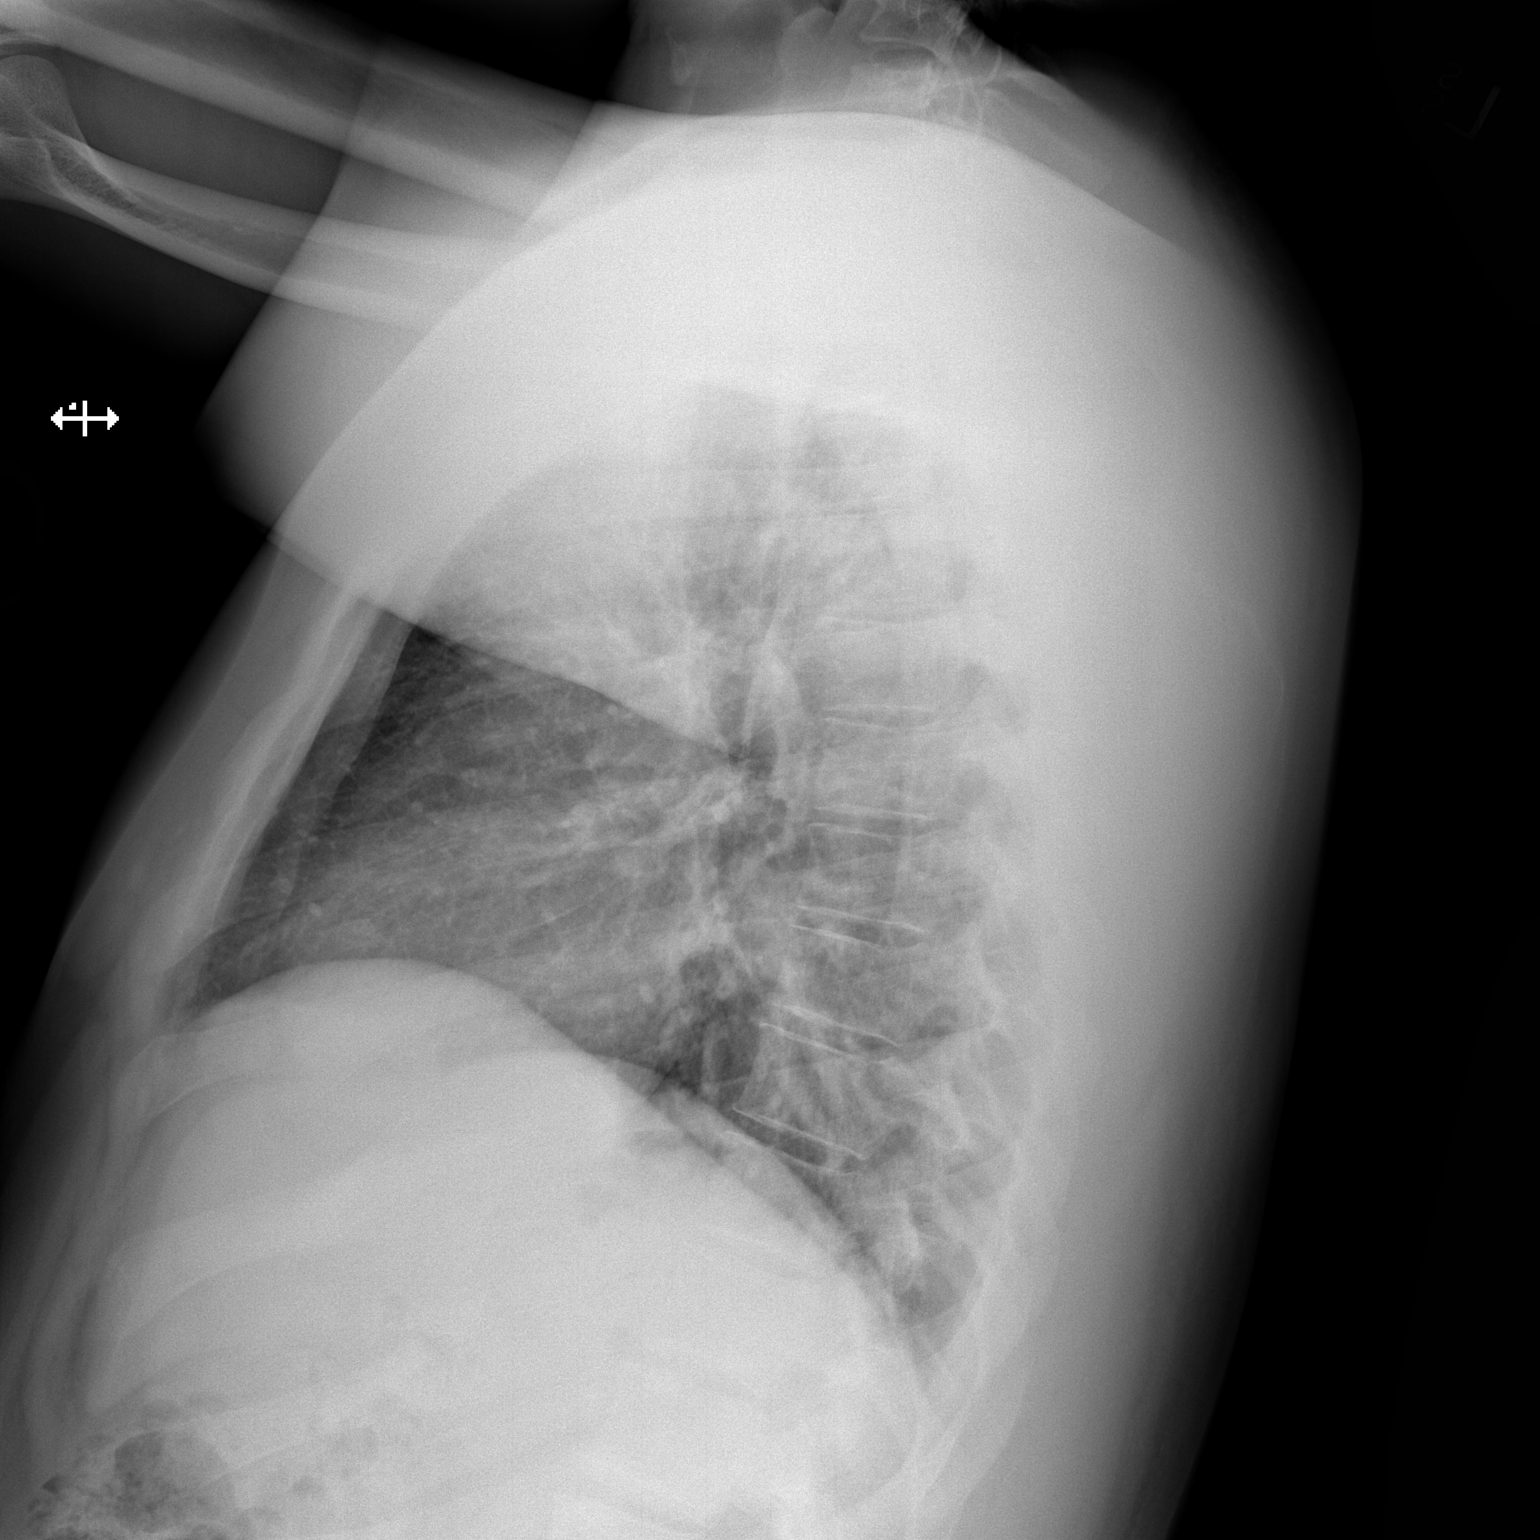

[2 of 2 positions shown; findings below may reference images not displayed]

FINDINGS: The lungs are well-aerated and clear. There is no evidence of focal
opacification, pleural effusion or pneumothorax.

The heart is normal in size; the mediastinal contour is within
normal limits. No acute osseous abnormalities are seen.
IMPRESSION: No acute cardiopulmonary process seen.
